# Patient Record
Sex: Male | Born: 1990 | Race: Black or African American | Hispanic: No | Marital: Single | Smoking: Former smoker
Health system: Southern US, Community
[De-identification: ages and names within clinical notes are randomized; demographics above are authoritative.]

## PROBLEM LIST (undated history)

## (undated) DIAGNOSIS — K602 Anal fissure, unspecified: Secondary | ICD-10-CM

## (undated) HISTORY — PX: ELBOW SURGERY: SHX618

## (undated) HISTORY — DX: Anal fissure, unspecified: K60.2

---
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
## 2009-09-09 ENCOUNTER — Emergency Department (HOSPITAL_COMMUNITY): Admission: EM | Admit: 2009-09-09 | Discharge: 2009-09-09 | Payer: Self-pay | Admitting: Emergency Medicine

## 2009-12-14 ENCOUNTER — Emergency Department (HOSPITAL_COMMUNITY): Admission: EM | Admit: 2009-12-14 | Discharge: 2009-12-14 | Payer: Self-pay | Admitting: Emergency Medicine

## 2010-01-03 ENCOUNTER — Emergency Department (HOSPITAL_COMMUNITY): Admission: EM | Admit: 2010-01-03 | Discharge: 2010-01-03 | Payer: Self-pay | Admitting: Emergency Medicine

## 2010-01-26 ENCOUNTER — Emergency Department (HOSPITAL_COMMUNITY): Admission: EM | Admit: 2010-01-26 | Discharge: 2010-01-26 | Payer: Self-pay | Admitting: Emergency Medicine

## 2010-02-19 ENCOUNTER — Emergency Department (HOSPITAL_COMMUNITY): Admission: EM | Admit: 2010-02-19 | Discharge: 2010-02-19 | Payer: Self-pay | Admitting: Emergency Medicine

## 2010-09-24 ENCOUNTER — Emergency Department (HOSPITAL_COMMUNITY): Admission: EM | Admit: 2010-09-24 | Discharge: 2010-09-24 | Payer: Self-pay | Admitting: Emergency Medicine

## 2010-09-26 ENCOUNTER — Emergency Department (HOSPITAL_COMMUNITY): Admission: EM | Admit: 2010-09-26 | Discharge: 2010-09-26 | Payer: Self-pay | Admitting: Emergency Medicine

## 2011-03-18 ENCOUNTER — Emergency Department (HOSPITAL_COMMUNITY)
Admission: EM | Admit: 2011-03-18 | Discharge: 2011-03-18 | Disposition: A | Discharge status: Home / Self Care | Payer: 59 | Attending: Emergency Medicine | Admitting: Emergency Medicine

## 2011-03-18 ENCOUNTER — Inpatient Hospital Stay (HOSPITAL_COMMUNITY)
Admission: AD | Admit: 2011-03-18 | Discharge: 2011-03-18 | Disposition: A | Discharge status: Other Acute Inpatient Hospital | Payer: 59 | Source: Ambulatory Visit | Attending: Obstetrics & Gynecology | Admitting: Obstetrics & Gynecology

## 2011-03-18 ENCOUNTER — Emergency Department (HOSPITAL_COMMUNITY): Payer: 59

## 2011-03-18 DIAGNOSIS — S0990XA Unspecified injury of head, initial encounter: Secondary | ICD-10-CM

## 2011-03-18 DIAGNOSIS — S0100XA Unspecified open wound of scalp, initial encounter: Secondary | ICD-10-CM

## 2011-03-18 DIAGNOSIS — Y92009 Unspecified place in unspecified non-institutional (private) residence as the place of occurrence of the external cause: Secondary | ICD-10-CM | POA: Insufficient documentation

## 2011-03-24 NOTE — Op Note (Signed)
[place]  Patient:    Jeremiah Griffith, Jeremiah Griffith Visit Number: [id number] MRN: [id number]          Service Type: SUR Location: PEDS 6121 [id number] Attending Physician:  Carolan Shiver Ii Dictated by:   Carlisle Beers. Dorothyann Gibbs, M.D. Proc. Date: 03/01/02 Admit Date:  03/01/2002                             Operative Report  PREOPERATIVE DIAGNOSIS:  Displaced supracondylar fracture left elbow.  SURGICAL PROCEDURES:  Closed manipulation and percutaneous pinning left supracondylar elbow fracture.  POSTOPERATIVE DIAGNOSIS:  Displaced supracondylar fracture left elbow.  SURGEON:  John L. Dorothyann Gibbs, M.D.  ASSISTANT:  Arnoldo Morale, P.A.- C.  ANESTHESIA:  General.  PROCEDURE:  Under general anesthesia, the left hand was placed in finger traps and the arm was suspended from an IV pole with the elbow flexed approximately 60 degrees and the humerus horizontal.  At this point, C-arm pictures showed anterior displacement of the distal humerus with angulation.  Closed manipulation was carried out with significant improvement in position and alignment.  Initial percutaneous pinning was done through two lateral pins and angulation of the distal fragment in flexion was encountered that was too much.  The pins were backed out and the fragment was remanipulated.  The pins were then reinserted and two pins lateral and one pin medial.  I gave excellent position and alignment of the supracondylar fracture.  Great care was taken to avoid the ulnar groove on the medial pin.  Significant swelling in the elbow was of course present.  Following final position, the pins were bent just outside the skin about a cm.  They were cut off removing any extra length.  Neosporin and Adaptic were placed about the base of the pins and sterile gauze and the patients arm was placed in a long arm splint with the elbow flexed approximately 50 degrees and the carrying angle was near anatomic.   The  patient returned to recovery in good condition. Dictated by:   Carlisle Beers. Dorothyann Gibbs, M.D. Attending Physician:  Carolan Shiver Ii DD:  03/01/02 TD:  03/02/02 Job: [id number]

## 2011-04-06 ENCOUNTER — Emergency Department (INDEPENDENT_AMBULATORY_CARE_PROVIDER_SITE_OTHER): Payer: 59

## 2011-04-06 ENCOUNTER — Emergency Department (HOSPITAL_BASED_OUTPATIENT_CLINIC_OR_DEPARTMENT_OTHER)
Admission: EM | Admit: 2011-04-06 | Discharge: 2011-04-06 | Disposition: A | Discharge status: Home / Self Care | Payer: 59 | Attending: Emergency Medicine | Admitting: Emergency Medicine

## 2011-04-06 DIAGNOSIS — R51 Headache: Secondary | ICD-10-CM

## 2011-04-06 LAB — GLUCOSE, CAPILLARY: Glucose-Capillary: 88 mg/dL (ref 70–99)

## 2011-04-14 ENCOUNTER — Encounter: Payer: Self-pay | Admitting: Gastroenterology

## 2011-04-14 ENCOUNTER — Ambulatory Visit (INDEPENDENT_AMBULATORY_CARE_PROVIDER_SITE_OTHER): Payer: 59 | Admitting: Gastroenterology

## 2011-04-14 ENCOUNTER — Other Ambulatory Visit (INDEPENDENT_AMBULATORY_CARE_PROVIDER_SITE_OTHER): Payer: 59

## 2011-04-14 DIAGNOSIS — E739 Lactose intolerance, unspecified: Secondary | ICD-10-CM

## 2011-04-14 DIAGNOSIS — K649 Unspecified hemorrhoids: Secondary | ICD-10-CM | POA: Insufficient documentation

## 2011-04-14 DIAGNOSIS — K5900 Constipation, unspecified: Secondary | ICD-10-CM | POA: Insufficient documentation

## 2011-04-14 LAB — CBC WITH DIFFERENTIAL/PLATELET
Basophils Absolute: 0 10*3/uL (ref 0.0–0.1)
Basophils Relative: 0.3 % (ref 0.0–3.0)
Eosinophils Absolute: 0 10*3/uL (ref 0.0–0.7)
Hemoglobin: 16.8 g/dL (ref 13.0–17.0)
Lymphocytes Relative: 29.1 % (ref 12.0–46.0)
Lymphs Abs: 1.7 10*3/uL (ref 0.7–4.0)
MCHC: 34.5 g/dL (ref 30.0–36.0)
MCV: 95.6 fl (ref 78.0–100.0)
Monocytes Absolute: 0.3 10*3/uL (ref 0.1–1.0)
Neutro Abs: 3.7 10*3/uL (ref 1.4–7.7)
RDW: 15 % — ABNORMAL HIGH (ref 11.5–14.6)

## 2011-04-14 LAB — BASIC METABOLIC PANEL
CO2: 31 mEq/L (ref 19–32)
GFR: 142.38 mL/min (ref >60.00)
Glucose, Bld: 87 mg/dL (ref 70–99)
Potassium: 4.9 mEq/L (ref 3.5–5.1)
Sodium: 141 mEq/L (ref 135–145)

## 2011-04-14 LAB — HEPATIC FUNCTION PANEL
Alkaline Phosphatase: 91 U/L (ref 39–117)
Bilirubin, Direct: 0.1 mg/dL (ref 0.0–0.3)
Total Bilirubin: 0.8 mg/dL (ref 0.3–1.2)
Total Protein: 8.3 g/dL (ref 6.0–8.3)

## 2011-04-14 LAB — SEDIMENTATION RATE: Sed Rate: 2 mm/hr (ref 0–22)

## 2011-04-14 LAB — VITAMIN B12: Vitamin B-12: 550 pg/mL (ref 211–911)

## 2011-04-14 LAB — IBC PANEL: Saturation Ratios: 24.8 % (ref 20.0–50.0)

## 2011-04-14 MED ORDER — HYDROCORTISONE ACE-PRAMOXINE 1-1 % RE CREA: TOPICAL_CREAM | RECTAL | Status: DC

## 2011-04-14 MED ORDER — POLYETHYLENE GLYCOL 3350 17 GM/SCOOP PO POWD: ORAL | Status: DC

## 2011-04-14 MED ORDER — LUBIPROSTONE 24 MCG PO CAPS: 24.0000 ug | ORAL_CAPSULE | Freq: Two times a day (BID) | ORAL | Status: AC

## 2011-04-14 MED ORDER — LIDOCAINE HCL 2 % EX GEL: CUTANEOUS | Status: DC

## 2011-04-14 NOTE — Progress Notes (Signed)
History of Present Illness:  This is a 20 year old African American male self-referred for evaluation of one and a half years of progressive constipation with associated hemorrhoidal bleeding and hemorrhoidal discomfort. He has not had previous barium studies or endoscopic exams. He goes to the bathroom every 3-4 days with laxative use, has associated straining and hemorrhoidal bleeding and discomfort. He describes prolapsing hemorrhoids partially responsive to cortisone suppositories. Family history is noncontributory. He denies other gastrointestinal or hepatobiliary complaints or general medical problems. He does have lactose intolerance but his appetite is good and his weight is stable. He denies skin rashes, joint pains, oral stomatitis, or visual difficulties.  I have reviewed this patient's present history, medical and surgical past history, allergies and medications.     ROS: The remainder of the 10 point ROS is negative. No symptoms of hyperthyroidism or other neurological difficulties. He denies previous hepatitis, pancreatitis, or other gastrointestinal problems or gastrointestinal surgery  Past Medical History  Diagnosis Date  . Hemorrhoids    Past Surgical History  Procedure Date  . Elbow surgery     reports that he has been smoking Cigarettes and Cigars.  He has smoked for the past 2 years. He has never used smokeless tobacco. He reports that he drinks alcohol. He reports that he does not use illicit drugs. family history is not on file.  He is adopted. Allergies  Allergen Reactions  . Penicillins         Physical Exam: General well developed well nourished patient in no acute distress, appearing his stated age Eyes PERRLA, no icterus, fundoscopic exam per opthamologist Skin no lesions noted Neck supple, no adenopathy, no thyroid enlargement, no tenderness Chest clear to percussion and auscultation Heart no significant murmurs, gallops or rubs noted Abdomen no  hepatosplenomegaly masses or tenderness, BS normal.  Rectal inspection .. anterior external hemorrhoid noted which is nonthrombosed and nontender. There also appears to be some mixed hemorrhoid tissue posteriorly without a definite fissure or fistula. Rectal exam shows no masses or tenderness with soft stool present which is guaiac negative. Prostate is not enlarged, nodular, or tender. Extremities no acute joint lesions, edema, phlebitis or evidence of cellulitis. Neurologic patient oriented x 3, cranial nerves intact, no focal neurologic deficits noted. Psychological mental status normal and normal affect.  Assessment and plan: Chronic functional constipation with associated straining and mixed hemorrhoidal problems. Have prescribed fiber diet with liberal by mouth fluids and Perdiem with senna as needed, MiraLax 8 ounces each bedtime, Amitiza 24 mcg twice a day as tolerated, and alternating 5% Xylocaine cream with Analpram cream every 4-6 hours. He is to see me back in 2 weeks' time for followup. Screening labs ordered today including CBC, anemia profile, metabolic functions, and celiac panel.  Encounter Diagnoses  Name Primary?  . Hemorrhoids   . Constipation

## 2011-04-14 NOTE — Patient Instructions (Signed)
Take Amitiza twice a day with meals, samples given and rx sent.  Use Analpram and Lidocaine alt every 6 hours per rectum, rx sent. Buy Perdum with Senna otc and take one tablet twice a day if you develop diarrhea stop this.  Please go to the basement today for your labs.    Hemorrhoids Hemorrhoids are dilated (enlarged) veins around the rectum. Sometimes clots will form in the veins. This makes them swollen and painful. These are called thrombosed hemorrhoids. Causes of hemorrhoids include:  Pregnancy: this increases the pressure in the hemorrhoidal veins.   Constipation.   Straining to have a bowel movement.  HOME CARE INSTRUCTIONS  Eat a well balanced diet and drink 6 to 8 glasses of water every day to avoid constipation. You may also use a bulk laxative.   Avoid straining to have bowel movements.   Keep anal area dry and clean.   Only take over-the-counter or prescription medicines for pain, discomfort, or fever as directed by your caregiver.  If thrombosed:  Take hot sitz baths for 20 to 30 minutes, 3 to 4 times per day.   If the hemorrhoids are very tender and swollen, place ice packs on area as tolerated. Using ice packs between sitz baths may be helpful. Fill a plastic bag with ice and use a towel between the bag of ice and your skin.   Special creams and suppositories (Anusol, Nupercainal, Wyanoids) may be used or applied as directed.   Do not use a donut shaped pillow or sit on the toilet for long periods. This increases blood pooling and pain.   Move your bowels when your body has the urge; this will require less straining and will decrease pain and pressure.   Only take over-the-counter or prescription medicines for pain, discomfort, or fever as directed by your caregiver.  SEEK MEDICAL CARE IF:  You have increasing pain and swelling that is not controlled with your prescription.   You have uncontrolled bleeding.   You have an inability or difficulty having a  bowel movement.   You have pain or inflammation outside the area of the hemorrhoids.   MAKE SURE YOU:   Understand these instructions.   Will watch your condition.   Will get help right away if you are not doing well or get worse.  Document Released: 10/20/2000 Document Re-Released: 10/05/2008 [facility] Patient Information 2011 [place], [place].

## 2011-04-16 ENCOUNTER — Emergency Department (HOSPITAL_BASED_OUTPATIENT_CLINIC_OR_DEPARTMENT_OTHER)
Admission: EM | Admit: 2011-04-16 | Discharge: 2011-04-16 | Disposition: A | Discharge status: Home / Self Care | Payer: 59 | Attending: Emergency Medicine | Admitting: Emergency Medicine

## 2011-04-16 ENCOUNTER — Emergency Department (INDEPENDENT_AMBULATORY_CARE_PROVIDER_SITE_OTHER): Payer: 59

## 2011-04-16 DIAGNOSIS — S61209A Unspecified open wound of unspecified finger without damage to nail, initial encounter: Secondary | ICD-10-CM

## 2011-04-16 DIAGNOSIS — W269XXA Contact with unspecified sharp object(s), initial encounter: Secondary | ICD-10-CM | POA: Insufficient documentation

## 2011-04-16 DIAGNOSIS — X58XXXA Exposure to other specified factors, initial encounter: Secondary | ICD-10-CM

## 2011-04-17 ENCOUNTER — Emergency Department (HOSPITAL_BASED_OUTPATIENT_CLINIC_OR_DEPARTMENT_OTHER)
Admission: EM | Admit: 2011-04-17 | Discharge: 2011-04-17 | Discharge status: Against Medical Advice | Payer: 59 | Attending: Emergency Medicine | Admitting: Emergency Medicine

## 2011-04-17 DIAGNOSIS — Z09 Encounter for follow-up examination after completed treatment for conditions other than malignant neoplasm: Secondary | ICD-10-CM | POA: Insufficient documentation

## 2011-04-17 LAB — CELIAC PANEL 10
Endomysial Screen: NEGATIVE
Gliadin IgG: 5.8 U/mL (ref <20)
IgA: 424 mg/dL — ABNORMAL HIGH (ref 68–379)
Tissue Transglutaminase Ab, IgA: 10.5 U/mL (ref <20)

## 2011-04-28 ENCOUNTER — Ambulatory Visit: Payer: 59 | Admitting: Gastroenterology

## 2011-05-07 HISTORY — PX: COLONOSCOPY W/ BIOPSIES: SHX1374

## 2011-05-17 ENCOUNTER — Encounter: Payer: Self-pay | Admitting: Gastroenterology

## 2011-05-17 ENCOUNTER — Ambulatory Visit (INDEPENDENT_AMBULATORY_CARE_PROVIDER_SITE_OTHER): Payer: 59 | Admitting: Gastroenterology

## 2011-05-17 ENCOUNTER — Telehealth: Payer: Self-pay | Admitting: Gastroenterology

## 2011-05-17 VITALS — BP 100/62 | HR 80 | Ht 69.0 in | Wt 129.6 lb

## 2011-05-17 DIAGNOSIS — K5900 Constipation, unspecified: Secondary | ICD-10-CM

## 2011-05-17 DIAGNOSIS — K602 Anal fissure, unspecified: Secondary | ICD-10-CM

## 2011-05-17 DIAGNOSIS — K6289 Other specified diseases of anus and rectum: Secondary | ICD-10-CM | POA: Insufficient documentation

## 2011-05-17 DIAGNOSIS — K601 Chronic anal fissure: Secondary | ICD-10-CM

## 2011-05-17 DIAGNOSIS — K5909 Other constipation: Secondary | ICD-10-CM

## 2011-05-17 MED ORDER — PEG-KCL-NACL-NASULF-NA ASC-C 100 G PO SOLR: 1.0000 | Freq: Once | ORAL | Status: DC

## 2011-05-17 MED ORDER — ULTRAM 50 MG PO TABS: 50.0000 mg | ORAL_TABLET | Freq: Four times a day (QID) | ORAL | Status: DC | PRN

## 2011-05-17 MED ORDER — AMBULATORY NON FORMULARY MEDICATION: Status: DC

## 2011-05-17 NOTE — Progress Notes (Signed)
This is a 20 year old male with continued rectal pain despite local Xylocaine for documented anal fissure. He also had rather severe constipation which is resolved with daily MiraLax. This problem is been going on for over a year. He has not had previous barium studies or colonoscopy. Review of his labs shows no specific abnormalities including celiac panel. Current Medications, Allergies, Past Medical History, Past Surgical History, Family History and Social History were reviewed in [employer] record.  Pertinent Review of Systems Negative   Physical Exam: I cannot appreciate stigmata of chronic liver disease. Abdominal exam is entirely benign. There is a posterior chronic-appearing fissure on rectal exam. I did not perform digital exam.    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 I have placed him on diltiazem  gel 4- 5 times a day, when necessary tramadol 50 mg every 6-8 hours for severe pain, and schedule colonoscopy with propofol sedation. Encounter Diagnosis  Name Primary?  . Rectal pain Yes

## 2011-05-17 NOTE — Telephone Encounter (Signed)
Advised there is a $20 rebate he would like to pick up when he comes for his procedure.

## 2011-05-17 NOTE — Patient Instructions (Addendum)
Your rxs have been sent to [facility] since the rectal cream can only be made there, the address is [address] The phone number is [phone]. Your procedure has been scheduled for 05/24/2011, please follow the seperate instructions.

## 2011-05-24 ENCOUNTER — Ambulatory Visit (AMBULATORY_SURGERY_CENTER): Payer: 59 | Admitting: Gastroenterology

## 2011-05-24 ENCOUNTER — Encounter: Payer: Self-pay | Admitting: Gastroenterology

## 2011-05-24 DIAGNOSIS — K602 Anal fissure, unspecified: Secondary | ICD-10-CM

## 2011-05-24 DIAGNOSIS — K6289 Other specified diseases of anus and rectum: Secondary | ICD-10-CM

## 2011-05-24 DIAGNOSIS — K620 Anal polyp: Secondary | ICD-10-CM

## 2011-05-24 DIAGNOSIS — D126 Benign neoplasm of colon, unspecified: Secondary | ICD-10-CM

## 2011-05-24 DIAGNOSIS — K625 Hemorrhage of anus and rectum: Secondary | ICD-10-CM

## 2011-05-24 DIAGNOSIS — R109 Unspecified abdominal pain: Secondary | ICD-10-CM

## 2011-05-24 MED ORDER — SODIUM CHLORIDE 0.9 % IV SOLN: 500.0000 mL | INTRAVENOUS | Status: DC

## 2011-05-24 NOTE — Patient Instructions (Signed)
Please refer to blue and green discharge instruction sheets. 

## 2011-05-25 ENCOUNTER — Telehealth: Payer: Self-pay | Admitting: *Deleted

## 2011-05-25 NOTE — Telephone Encounter (Signed)
Not a working number for home nor cell.

## 2011-05-29 ENCOUNTER — Ambulatory Visit (INDEPENDENT_AMBULATORY_CARE_PROVIDER_SITE_OTHER): Payer: 59 | Admitting: General Surgery

## 2011-05-29 ENCOUNTER — Telehealth (INDEPENDENT_AMBULATORY_CARE_PROVIDER_SITE_OTHER): Payer: Self-pay | Admitting: General Surgery

## 2011-05-29 ENCOUNTER — Encounter (INDEPENDENT_AMBULATORY_CARE_PROVIDER_SITE_OTHER): Payer: Self-pay | Admitting: General Surgery

## 2011-05-29 VITALS — BP 100/64 | HR 64 | Temp 96.2°F | Ht 69.0 in | Wt 130.0 lb

## 2011-05-29 DIAGNOSIS — K644 Residual hemorrhoidal skin tags: Secondary | ICD-10-CM

## 2011-05-29 DIAGNOSIS — K602 Anal fissure, unspecified: Secondary | ICD-10-CM

## 2011-05-29 NOTE — Progress Notes (Signed)
Jeremiah Griffith is a 20 y.o. male.    Chief Complaint  Patient presents with  . Other    eval hem/colonoscopy    HPI HPI 20 year old African American male referred by Dr. Jarold Motto for evaluation of rectal pain. The patient states that he's had several years of rectal pain. He states that he has a fissure as well as an external hemorrhoid. He describes that he will have a "flare up" about 2-3 times per month for the past 2 years. When he has a flareup, he states that his external hemorrhoid will enlarge. it will become painful for several days. It'll be difficult to sit down. The worsening pain will last for several hours. The flare will last for about 3 days. It will also burn and itch.   He states that he has a bowel movement about every 2-3 days. He will sit on the commode for 5-7 minutes at a time. He denies any fecal or urinary incontinence. He does strain on occasion. He states that he was diagnosed with a fissure several months ago. He has tried lidocaine ointment, Analpram, suppositories and tub soaks without relief. He was placed on diltiazem ointment for his fissure. He used it several times a day for 2 weeks and stopped because he did not notice any improvement. He denies any crampy abdominal pain. He denies any weight loss. He denies any fevers or chills. He denies any melena or hematochezia. He underwent a colonoscopy several days ago.  Past Medical History  Diagnosis Date  . Hemorrhoids   . Anal fissure     Past Surgical History  Procedure Date  . Elbow surgery     left  . Colonoscopy w/ biopsies 05/2011    Family History  Problem Relation Age of Onset  . Adopted: Yes    Social History History  Substance Use Topics  . Smoking status: Never Smoker   . Smokeless tobacco: Never Used   Comment: 3 a week  . Alcohol Use: Yes     2-3 weekend drinks    Allergies  Allergen Reactions  . Penicillins Other (See Comments)    Happened when patient was a child. Does not  remember the reaction.    Current Outpatient Prescriptions  Medication Sig Dispense Refill  . ULTRAM 50 MG tablet Take 1 tablet (50 mg total) by mouth every 6 (six) hours as needed for pain.  90 tablet  0  . AMBULATORY NON FORMULARY MEDICATION Diltiazem gel 2% Apply small amount to rectum four times a day  20 oz  1  . lidocaine (XYLOCAINE JELLY) 2 % jelly Alt every 6 hours with Analpram  30 mL  1  . peg 3350 powder (MOVIPREP) 100 G SOLR Take 1 kit (100 g total) by mouth once.  1 kit  0  . polyethylene glycol powder (MIRALAX) powder Mix one capful in 8 ounces of water and drink at bedtime  255 g  0  . pramoxine-hydrocortisone (ANALPRAM-HC) 1-1 % rectal cream Alt every 6 hours with lidocaine jelly  30 g  1   Current Facility-Administered Medications  Medication Dose Route Frequency Provider Last Rate Last Dose  . 0.9 %  sodium chloride infusion  500 mL Intravenous Continuous Sheryn Bison, MD        Review of Systems Review of Systems  Constitutional: Negative for fever, chills and weight loss.       No h/o STDs  HENT: Negative.   Eyes: Negative.   Respiratory: Negative.  Cardiovascular: Negative for chest pain and palpitations.  Gastrointestinal:       See hpi  Genitourinary: Negative for dysuria and urgency.  Musculoskeletal: Negative.   Skin: Negative.   Neurological: Negative.   Endo/Heme/Allergies: Negative.   Psychiatric/Behavioral: Negative.   All other systems reviewed and are negative.    Physical Exam Physical Exam  Vitals reviewed. Constitutional: He is oriented to person, place, and time. He appears well-developed and well-nourished.  HENT:  Head: Normocephalic and atraumatic.  Eyes: Conjunctivae are normal. No scleral icterus.  Neck: Normal range of motion. No tracheal deviation present.  Cardiovascular: Normal rate, regular rhythm and normal heart sounds.   Respiratory: Effort normal and breath sounds normal. He has no wheezes.  GI: Soft. Bowel sounds  are normal. He exhibits no distension. There is no tenderness. Hernia confirmed negative in the right inguinal area and confirmed negative in the left inguinal area.  Genitourinary: Testes normal and penis normal. Rectal exam shows external hemorrhoid (left posterolateral; adj to fissure) and fissure (posterior midline).          DRE deferred secondary to pt discomfort  Musculoskeletal: Normal range of motion.  Lymphadenopathy:    He has no cervical adenopathy.       Right: No inguinal adenopathy present.       Left: No inguinal adenopathy present.  Neurological: He is alert and oriented to person, place, and time.  Skin: Skin is warm and dry.  Psychiatric: He has a normal mood and affect. His behavior is normal. Thought content normal.     Blood pressure 100/64, pulse 64, temperature 96.2 F (35.7 C), temperature source Temporal, height 5\' 9"  (1.753 m), weight 130 lb (58.968 kg).   Data Reviewed: 1) Dr Norval Gable note 2)Dr Byerly's note from 2009 3) colonoscopy from 05/24/11- no IBD; sessile rectal polyp-snared & biopsied; anal fissure; Path Pending  Assessment/Plan 20 year old African male with a single column left posterior lateral external hemorrhoid and an anal fissure.  He states that intermittently engorgement of the external hemorrhoid has been ongoing for at least a year and a half. This is what causes him is most discomfort. He states the fissure has only been present for about 2 months.  We discussed management of anal fissures as well as external hemorrhoids. I think the underlying problem is his infrequent stools. I advised him that he would have ongoing problems as long as he had infrequent stools. He was given Agricultural engineer. I recommended that he increase his daily water intake as well as increasing his daily fiber intake. We discussed foods that were high in fiber. We also discussed adding supplemental fiber in his diet. I recommended that he slowly increase the  fiber over several weeks. I also recommended that he continue to use diltiazem ointment for his anal fissure.  We discussed timing of surgical intervention. The patient would like to have his external hemorrhoid excised as he believes this is the primary source of his problems. I explained to him that I could excise this external hemorrhoid but this in no way would guarantee resolution of his rectal pain. We discussed postoperative stratergies that would decrease his postoperative pain such as warm tub soaks, stool softeners, laxatives as needed, and avoidance of narcotics.   We also discussed nonsurgical and surgical management anal fissures. I recommended continued nonsurgical management for the time being. I recommended that he continue to use the diltiazem ointment for a minimum of 6 weeks as well as increasing the fiber in his  diet in an intent to make his stools more regular which will hopefully improve his anal fissure. We did discuss lateral anal sphincterotomy. We discussed the potential risk of incontinence to flatus, incontinence to liquid stool, and incontinence the solid stool.   We will plan to do an exam under anesthesia and excision of a left posterior lateral single column external hemorrhoid in the near future. He will do an enema the morning of surgery.  Gaynelle Adu M 05/29/2011, 11:26 AM

## 2011-05-29 NOTE — Patient Instructions (Addendum)
Day of surgery: please use one fleets enema per rectum atleast two hours prior to leaving the house.  Anal Fissure An anal fissure often begins with sharp pain. This is usually following a bowel movement. It often causes bright red blood stained stools. It is the most common cause of rectal bleeding. One common cause of this is passage of a large, hard stool. It can also be caused by having frequent diarrheal stools. Anal fissures that occur for a longtime (chronic) may require surgery. CAUSES  Passing large, hard stools.   Frequent diarrheal stools.   Constipation.  SYMPTOMS  Bright red, blood stained stools.   Rectal bleeding.  HOME CARE INSTRUCTIONS  If constipation is the cause of the rectal fissure, it may be necessary to add a bulk-forming laxative. A diet high in fruits, whole grains, and vegetables will also help.   Taking hot sitz baths for 1 half hour 4 times per day may help.   Increase your fluid intake.   Only take over-the-counter or prescription medicines for pain, discomfort, or fever as directed by your caregiver. Do not take aspirin as this may increase the tendency for bleeding.   Do not use ointments containing anesthetic medications or hydrocortisone. They could slow healing.   Avoid constipating foods such as bananas and cheese.   In children, Nikkel Karo syrup may be used by adding 1 teaspoon of syrup to 8 ounces of formula. An alternative is to give 3 teaspoons of mineral oil every day.  SEEK MEDICAL CARE IF: Rectal bleeding continues, changes in intensity, or becomes more severe. MAKE SURE YOU:   Understand these instructions.   Will watch your condition.   Will get help right away if you are not doing well or get worse.  Document Released: 10/23/2005 Document Re-Released: 01/17/2010 [facility] Patient Information 2011 [place], [place].  Constipation in Adults Constipation is having fewer than 2 bowel movements per week. Usually, the stools are hard.  As we grow older, constipation is more common. If you try to fix constipation with laxatives, the problem may get worse. This is because laxatives taken over a long period of time make the colon muscles weaker. A low-fiber diet, not taking in enough fluids, and taking some medicines may make these problems worse. MEDICATIONS THAT MAY CAUSE CONSTIPATION  Water pills (diuretics).  Calcium channel blockers (used to control blood pressure and for the heart).   Certain pain medicines (narcotics).   Anticholinergics.  Anti-inflammatory agents.   Antacids that contain aluminum.   DISEASES THAT CONTRIBUTE TO CONSTIPATION  Diabetes.  Parkinson's disease.   Dementia.   Stroke.  Depression.   Illnesses that cause problems with salt and water metabolism.   HOME CARE INSTRUCTIONS  Constipation is usually best cared for without medicines. Increasing dietary fiber and eating more fruits and vegetables is the best way to manage constipation.   Slowly increase fiber intake to 25 to 38 grams per day. Whole grains, fruits, vegetables, and legumes are good sources of fiber. A dietitian can further help you incorporate high-fiber foods into your diet.   Drink enough water and fluids to keep your urine clear or pale yellow.   A fiber supplement may be added to your diet if you cannot get enough fiber from foods.   Increasing your activities also helps improve regularity.   Suppositories, as suggested by your caregiver, will also help. If you are using antacids, such as aluminum or calcium containing products, it will be helpful to switch to products  containing magnesium if your caregiver says it is okay.   If you have been given a liquid injection (enema) today, this is only a temporary measure. It should not be relied on for treatment of longstanding (chronic) constipation.   Stronger measures, such as magnesium sulfate, should be avoided if possible. This may cause uncontrollable diarrhea. Using  magnesium sulfate may not allow you time to make it to the bathroom.  SEEK IMMEDIATE MEDICAL CARE IF:  There is bright red blood in the stool.   The constipation stays for more than 4 days.   There is belly (abdominal) or rectal pain.   You do not seem to be getting better.   You have any questions or concerns.  MAKE SURE YOU:  Understand these instructions.   Will watch your condition.   Will get help right away if you are not doing well or get worse.  Document Released: 07/21/2004 Document Re-Released: 01/17/2010 [facility] Patient Information 2011 [place], [place].

## 2011-05-29 NOTE — Telephone Encounter (Signed)
Patient called back and I advised one week should be enough time, but we can always extended that if needed. He asked for a note for work which I will write to today and leave at the front for patient to pick up. He will call back with any further questions.

## 2011-05-29 NOTE — Telephone Encounter (Signed)
Patient called and left message on my voicemail re: time out of work. Per Dr Andrey Campanile patient should take about a week off of work. I called patient back and got voicemail and left message for patient to call me back.

## 2011-05-30 ENCOUNTER — Encounter: Payer: Self-pay | Admitting: Gastroenterology

## 2011-06-07 ENCOUNTER — Other Ambulatory Visit (HOSPITAL_COMMUNITY): Payer: 59

## 2011-06-09 ENCOUNTER — Other Ambulatory Visit (INDEPENDENT_AMBULATORY_CARE_PROVIDER_SITE_OTHER): Payer: Self-pay | Admitting: General Surgery

## 2011-06-09 ENCOUNTER — Encounter (HOSPITAL_COMMUNITY): Payer: 59

## 2011-06-09 LAB — CBC
HCT: 44.2 % (ref 39.0–52.0)
MCHC: 33.5 g/dL (ref 30.0–36.0)
RDW: 13.5 % (ref 11.5–15.5)
WBC: 7.5 10*3/uL (ref 4.0–10.5)

## 2011-06-09 LAB — SURGICAL PCR SCREEN
MRSA, PCR: NEGATIVE
Staphylococcus aureus: NEGATIVE

## 2011-06-13 ENCOUNTER — Ambulatory Visit (HOSPITAL_COMMUNITY)
Admission: RE | Admit: 2011-06-13 | Discharge: 2011-06-13 | Disposition: A | Discharge status: Home / Self Care | Payer: 59 | Source: Ambulatory Visit | Attending: General Surgery | Admitting: General Surgery

## 2011-06-13 DIAGNOSIS — K649 Unspecified hemorrhoids: Secondary | ICD-10-CM

## 2011-06-13 DIAGNOSIS — K602 Anal fissure, unspecified: Secondary | ICD-10-CM | POA: Insufficient documentation

## 2011-06-13 DIAGNOSIS — Z01812 Encounter for preprocedural laboratory examination: Secondary | ICD-10-CM | POA: Insufficient documentation

## 2011-06-13 DIAGNOSIS — K644 Residual hemorrhoidal skin tags: Secondary | ICD-10-CM | POA: Insufficient documentation

## 2011-06-13 HISTORY — PX: EXCISIONAL HEMORRHOIDECTOMY: SHX1541

## 2011-06-14 ENCOUNTER — Telehealth (INDEPENDENT_AMBULATORY_CARE_PROVIDER_SITE_OTHER): Payer: Self-pay

## 2011-06-14 NOTE — Telephone Encounter (Signed)
Pt just had hem surgery yesterday and c/o the hemorrhoid still being there.  I told him this is swelling and will get better.  He is taking Oxycodone and Aleve q4prn without relief.  The pain seems worse.  He is drinking liquids and taking a fiber supplement.  He has only soaked in a tub twice since surgery.  I told him make sure he does it at least 5 times to help with swelling and pain.  I spoke to Dr Andrey Campanile and he did not want to change any of his orders but to increase his soaking.  I enforced this with the pt and asked him to call back by Friday if no better

## 2011-06-16 ENCOUNTER — Encounter (INDEPENDENT_AMBULATORY_CARE_PROVIDER_SITE_OTHER): Payer: Self-pay | Admitting: General Surgery

## 2011-06-16 ENCOUNTER — Ambulatory Visit (INDEPENDENT_AMBULATORY_CARE_PROVIDER_SITE_OTHER): Payer: 59 | Admitting: General Surgery

## 2011-06-16 VITALS — BP 112/60 | HR 84 | Temp 97.6°F | Ht 69.0 in | Wt 132.0 lb

## 2011-06-16 DIAGNOSIS — Z8719 Personal history of other diseases of the digestive system: Secondary | ICD-10-CM

## 2011-06-16 DIAGNOSIS — Z09 Encounter for follow-up examination after completed treatment for conditions other than malignant neoplasm: Secondary | ICD-10-CM

## 2011-06-16 DIAGNOSIS — Z9889 Other specified postprocedural states: Secondary | ICD-10-CM

## 2011-06-16 MED ORDER — OXYCODONE-ACETAMINOPHEN 7.5-325 MG PO TABS: 1.0000 | ORAL_TABLET | ORAL | Status: DC | PRN

## 2011-06-16 NOTE — Progress Notes (Signed)
Procedure: Exam under anesthesia, left posterior lateral excisional hemorrhoidectomy June 13 2011  History of Present Ilness: Patient underwent the above-mentioned procedure several days ago. He comes in complaining of severe perirectal pain. He denies any trouble urinating. He has not had a bowel movement since surgery. He denies any fevers or chills. He denies any drainage from his rectum. He has been taking the Percocet every 4 hours 2 tablets at a time. He states that he's also been soaking in a sitz bath several times a day. He also has been taking stool softeners and Citrucel without any relief of his rectal pain. He has not been using any lidocaine ointment. He's also been taking some Aleve.  Physical Exam: BP 112/60  Pulse 84  Temp 97.6 F (36.4 C)  Ht 5\' 9"  (1.753 m)  Wt 132 lb (59.875 kg)  BMI 19.49 kg/m2  Well-developed well-nourished African American male in no apparent distress Pulmonary-lungs are clear to auscultation Cardiac-regular rate and rhythm Abdomen-soft nontender nondistended Rectal-incision in the left posterior lateral position, chromic sutures are still intact in place. No cellulitis. No hematoma. No overt edema. Digital rectal exam was deferred   Assessment and Plan: Status post exam under anesthesia with excision of left posterior lateral external hemorrhoid with postoperative pain.  There are no signs of infection. I've encouraged to continue to drink 8-10 glasses of water a day. Also encouraged him to continue with warm water tub soaks  several times a day. Also told him he needs to take MiraLax or milk of magnesia today in order to have a bowel movement. He was given a prescription for Percocet 7-1/2 mg. I also encouraged him to start using the lidocaine ointment that he has at home. Hopefully with these measures his pain will improve. He was given educational material  I'll see him at his regular scheduled postop appointment.

## 2011-06-16 NOTE — Progress Notes (Deleted)
Transfer text

## 2011-06-16 NOTE — Op Note (Signed)
NAMERAPHEL, STICKLES                 ACCOUNT NO.:  [id number]  MEDICAL RECORD NO.:  [id number]  LOCATION:  DAYL                         FACILITY:  [facility]  PHYSICIAN:  Mary Sella. Andrey Campanile, MD     DATE OF BIRTH:  1991/10/03  DATE OF PROCEDURE:  06/13/2011 DATE OF DISCHARGE:  06/13/2011                              OPERATIVE REPORT   PREOPERATIVE DIAGNOSES: 1. Anal fissure. 2. Left posterolateral external hemorrhoid.  POSTOPERATIVE DIAGNOSES: 1. Anal fissure. 2. Left posterolateral external hemorrhoid.  PROCEDURES: 1. Examination under anesthesia. 2. Excision of left posterolateral external hemorrhoid.  SURGEON:  Mary Sella. Andrey Campanile, MD  ANESTHESIA:  General plus 20 mL of Exparel plus 4 mL of 0.25% Marcaine with epinephrine.  SPECIMEN:  Left posterolateral external hemorrhoid, which was discarded.  ESTIMATED BLOOD LOSS:  Minimal.  FINDINGS:  The patient had a very small posterior midline anal fissure. There was a large amount of stool within his rectal vault, so the anoscopy was somewhat limited due to the stool burden.  He had nonthrombosed external hemorrhoids in the left posterolateral position.  INDICATIONS FOR PROCEDURE:  Patient is a 20 year old male who has had several years of rectal pain.  He states that he has had an anal fissure as well as an external hemorrhoid.  His main complaint is his external hemorrhoid.  He says about two to three times a month the external hemorrhoid will enlarge and gets swollen and will be painful for several days.  The pain will last for about 3 days.  He had undergone a colonoscopy by Dr. Jarold Motto.  We discussed several options such as continued management for his anal fissure versus going to the operating room for his external hemorrhoid.  The patient did not have daily pain with bowel movements.  He was adamant that the area where the redundant external hemorrhoidal tissue was, that this was the location where it would flare-up and gets  edematous and swollen and he had desired surgical intervention first prior to undergoing continued medical management.  We discussed the risks and benefits of surgery including but not limited to bleeding, infection, urinary retention, hemorrhoid recurrence, failure to ameliorate his pain, injury to the sphincter, blood clot formation as well as general anesthesia risks.  DESCRIPTION OF PROCEDURE:  After obtaining informed consent, the patient was taken back to the operating room.  It should be noted that the patient forgot to do and failed to do his enema the morning of surgery. But since this was an external hemorrhoid, I elected to proceed to the operating room.  General anesthesia was established.  He was then placed in the prone jackknife position on the operating room table with the appropriate padding and sequential compression devices.  His buttocks were taped apart.  His perineum and anal region were prepped with Betadine.  He received antibiotics prior to skin incision.  A surgical time-out was performed.  On gross visual inspection, he had a very small anal fissure in his posterior midline.  It was about 4 mm and about 2 mm wide.  In the left posterolateral position he had nonthrombosed redundant external hemorrhoidal tissue.  Anoscopy was performed; however, it was somewhat  limited due to the amount of the stool in the rectal vault.  Grossly there was no large internal hemorrhoids.  At this point I infiltrated some local with epinephrine at the base of the external hemorrhoidal tissue massaged and replaced.  I then made a V-shaped incision with #15 blade and then lifted the external hemorrhoid off from the underlying submucosa.  Then using electrocautery, I excised the external hemorrhoid in an elliptical fashion all the way up to its apex.  I then reapproximated the mucosa with a running 3-0 chromic suture starting internally and carrying it down toward his perineum.  I  left a small gap to allow drainage.  At this point it had been about 20 minutes since I injected the initial local and at this point I then did a field block of Exparel consisting of 20 mL.  A piece of Gelfoam was placed in the rectum.  The patient was then rolled into the supine position on the OR stretcher, extubated and taken to the recovery room in stable condition. Prior to doing that 4x4s, ABDs, and mesh underwear had been placed.  The patient tolerated the procedure well, no immediate complications.     Mary Sella. Andrey Campanile, MD     EMW/MEDQ  D:  06/13/2011  T:  06/14/2011  Job:  [id number]  cc:   Vania Rea. Jarold Motto, MD, FACG, FACP, FAGA [address] [place] [address]  Electronically Signed by Gaynelle Adu M.D. on 06/16/2011 10:00:57 AM

## 2011-06-16 NOTE — Patient Instructions (Signed)
Take miralax or milk of magnesia today. Continue soaking in a water bath several times a day for 15 min & after a bowel movement.  Starting using Lidocaine ointment as previously prescribed.  GETTING TO GOOD BOWEL HEALTH. Irregular bowel habits such as constipation and diarrhea can lead to many problems over time.  Having one soft bowel movement a day is the most important way to prevent further problems.  The anorectal canal is designed to handle stretching and feces to safely manage our ability to get rid of solid waste (feces, poop, stool) out of our body.  BUT, hard constipated stools can act like ripping concrete bricks and diarrhea can be a burning fire to this very sensitive area of our body, causing inflamed hemorrhoids, anal fissures, increasing risk is perirectal abscesses, abdominal pain/bloating, an making irritable bowel worse.     The goal: ONE SOFT BOWEL MOVEMENT A DAY!  To have soft, regular bowel movements:    Drink at least 8 tall glasses of water a day.     Take plenty of fiber.  Fiber is the undigested part of plant food that passes into the colon, acting s "natures broom" to encourage bowel motility and movement.  Fiber can absorb and hold large amounts of water. This results in a larger, bulkier stool, which is soft and easier to pass. Work gradually over several weeks up to 6 servings a day of fiber (25g a day even more if needed) in the form of: o Vegetables -- Root (potatoes, carrots, turnips), leafy green (lettuce, salad greens, celery, spinach), or cooked high residue (cabbage, broccoli, etc) o Fruit -- Fresh (unpeeled skin & pulp), Dried (prunes, apricots, cherries, etc ),  or stewed ( applesauce)  o Whole grain breads, pasta, etc (whole wheat)  o Bran cereals    Bulking Agents -- This type of water-retaining fiber generally is easily obtained each day by one of the following:  o Psyllium bran -- The psyllium plant is remarkable because its ground seeds can retain so much  water. This product is available as Metamucil, Konsyl, Effersyllium, Per Diem Fiber, or the less expensive generic preparation in drug and health food stores. Although labeled a laxative, it really is not a laxative.  o Methylcellulose -- This is another fiber derived from wood which also retains water. It is available as Citrucel. o Polyethylene Glycol - and "artificial" fiber commonly called Miralax or Glycolax.  It is helpful for people with gassy or bloated feelings with regular fiber o Flax Seed - a less gassy fiber than psyllium   No reading or other relaxing activity while on the toilet. If bowel movements take longer than 5 minutes, you are too constipated   AVOID CONSTIPATION.  High fiber and water intake usually takes care of this.  Sometimes a laxative is needed to stimulate more frequent bowel movements, but    Laxatives are not a good long-term solution as it can wear the colon out. o Osmotics (Milk of Magnesia, Fleets phosphosoda, Magnesium citrate, MiraLax, GoLytely) are safer than  o Stimulants (Senokot, Castor Oil, Dulcolax, Ex Lax)    o Do not take laxatives for more than 7days in a row.    IF SEVERELY CONSTIPATED, try a Bowel Retraining Program: o Do not use laxatives.  o Eat a diet high in roughage, such as bran cereals and leafy vegetables.  o Drink six (6) ounces of prune or apricot juice each morning.  o Eat two (2) large servings of stewed fruit  each day.  o Take one (1) heaping tablespoon of a psyllium-based bulking agent twice a day. Use sugar-free sweetener when possible to avoid excessive calories.  o Eat a normal breakfast.  o Set aside 15 minutes after breakfast to sit on the toilet, but do not strain to have a bowel movement.  o If you do not have a bowel movement by the third day, use an enema and repeat the above steps.    Controlling diarrhea o Switch to liquids and simpler foods for a few days to avoid stressing your intestines further. o Avoid dairy products  (especially milk & ice cream) for a short time.  The intestines often can lose the ability to digest lactose when stressed. o Avoid foods that cause gassiness or bloating.  Typical foods include beans and other legumes, cabbage, broccoli, and dairy foods.  Every person has some sensitivity to other foods, so listen to our body and avoid those foods that trigger problems for you. o Adding fiber (Citrucel, Metamucil, psyllium, Miralax) gradually can help thicken stools by absorbing excess fluid and retrain the intestines to act more normally.  Slowly increase the dose over a few weeks.  Too much fiber too soon can backfire and cause cramping & bloating. o Probiotics (such as active yogurt, Align, etc) may help repopulate the intestines and colon with normal bacteria and calm down a sensitive digestive tract.  Most studies show it to be of mild help, though, and such products can be costly. o Medicines:   Bismuth subsalicylate (ex. Kayopectate, Pepto Bismol) every 30 minutes for up to 6 doses can help control diarrhea.  Avoid if pregnant.   Loperamide (Immodium) can slow down diarrhea.  Start with two tablets (4mg  total) first and then try one tablet every 6 hours.  Avoid if you are having fevers or severe pain.  If you are not better or start feeling worse, stop all medicines and call your doctor for advice o Call your doctor if you are getting worse or not better.  Sometimes further testing (cultures, endoscopy, X-ray studies, bloodwork, etc) may be needed to help diagnose and treat the cause of the diarrhea. o

## 2011-06-17 ENCOUNTER — Encounter (HOSPITAL_BASED_OUTPATIENT_CLINIC_OR_DEPARTMENT_OTHER): Payer: Self-pay | Admitting: Emergency Medicine

## 2011-06-17 ENCOUNTER — Emergency Department (HOSPITAL_BASED_OUTPATIENT_CLINIC_OR_DEPARTMENT_OTHER)
Admission: EM | Admit: 2011-06-17 | Discharge: 2011-06-17 | Disposition: A | Discharge status: Home / Self Care | Payer: 59 | Attending: Emergency Medicine | Admitting: Emergency Medicine

## 2011-06-17 DIAGNOSIS — IMO0002 Reserved for concepts with insufficient information to code with codable children: Secondary | ICD-10-CM | POA: Insufficient documentation

## 2011-06-17 DIAGNOSIS — Y838 Other surgical procedures as the cause of abnormal reaction of the patient, or of later complication, without mention of misadventure at the time of the procedure: Secondary | ICD-10-CM | POA: Insufficient documentation

## 2011-06-17 MED ORDER — DOCUSATE SODIUM 100 MG PO CAPS: 100.0000 mg | ORAL_CAPSULE | Freq: Two times a day (BID) | ORAL | Status: AC

## 2011-06-17 NOTE — ED Provider Notes (Signed)
History     CSN: [id number] Arrival date & time: 06/17/2011  2:22 AM  Chief Complaint  Patient presents with  . Hemorrhoids  . Rectal Bleeding   HPI Complains of rectal bleeding at site of hemorrhoid surgery. Patient underwent hemorrhoidectomy at [facility] last week bleeding has stopped since arrival here .states it was profuse earlier tonight. Denies fever denies abdominal pain denies lightheadedness complains of pain at surgical site. Has been prescribed oxycodone however he is hesitant to take it as he knows that it causes constipation  Past Medical History  Diagnosis Date  . Hemorrhoids   . Anal fissure     Past Surgical History  Procedure Date  . Elbow surgery     left  . Colonoscopy w/ biopsies 05/2011  . Excisional hemorrhoidectomy 06/13/2011    left posterolateral external hemorrhoidectomy    Family History  Problem Relation Age of Onset  . Adopted: Yes    History  Substance Use Topics  . Smoking status: Former Smoker -- 2 years    Types: Cigarettes, Cigars  . Smokeless tobacco: Never Used   Comment: 3 a week  . Alcohol Use: Yes     2-3 weekend drinks      Review of Systems  Constitutional: Negative.   HENT: Negative.   Respiratory: Negative.   Cardiovascular: Negative.   Gastrointestinal: Positive for anal bleeding and rectal pain.  Musculoskeletal: Negative.   Skin: Negative.   Neurological: Negative.   Hematological: Negative.   Psychiatric/Behavioral: Negative.     Physical Exam  BP 133/77  Pulse 58  Temp(Src) 98.5 F (36.9 C) (Oral)  Resp 16  SpO2 100%  Physical Exam  Constitutional: He appears well-developed and well-nourished.  HENT:  Head: Normocephalic and atraumatic.  Eyes: Conjunctivae are normal. Pupils are equal, round, and reactive to light.  Neck: Neck supple. No tracheal deviation present. No thyromegaly present.  Pulmonary/Chest: Effort normal.  Abdominal: Soft. Bowel sounds are normal. He exhibits no  distension. There is no tenderness.  Genitourinary:       Perianal area: There is proximally 1 cm open wound clean appearing no active bleeding minimally tender no redness no swelling  Musculoskeletal: Normal range of motion. He exhibits no edema and no tenderness.  Neurological: He is alert. Coordination normal.  Skin: Skin is warm and dry. No rash noted.  Psychiatric: He has a normal mood and affect.    ED Course  Procedures  MDM Postop bleeding, probably normal and expected. Plan prescription of Colace followup [place] surgery      Doug Sou, MD 06/17/11 [phone]

## 2011-06-17 NOTE — ED Notes (Signed)
Pt had hemorrhoid surgery tues. Tonight started having bleeding spontaneously.

## 2011-06-21 ENCOUNTER — Encounter (INDEPENDENT_AMBULATORY_CARE_PROVIDER_SITE_OTHER): Payer: Self-pay

## 2011-06-21 ENCOUNTER — Telehealth (INDEPENDENT_AMBULATORY_CARE_PROVIDER_SITE_OTHER): Payer: Self-pay

## 2011-06-21 NOTE — Telephone Encounter (Signed)
Patient called in and stated he was still having a little pain and bleeding still and so he was not able to go back to work today. I wrote a note for him to be out the rest of the week and for him to return on Monday.

## 2011-07-06 ENCOUNTER — Encounter (INDEPENDENT_AMBULATORY_CARE_PROVIDER_SITE_OTHER): Payer: Self-pay | Admitting: General Surgery

## 2011-07-06 ENCOUNTER — Ambulatory Visit (INDEPENDENT_AMBULATORY_CARE_PROVIDER_SITE_OTHER): Payer: 59 | Admitting: General Surgery

## 2011-07-06 VITALS — BP 120/82 | HR 80

## 2011-07-06 DIAGNOSIS — Z9889 Other specified postprocedural states: Secondary | ICD-10-CM

## 2011-07-06 DIAGNOSIS — Z8719 Personal history of other diseases of the digestive system: Secondary | ICD-10-CM

## 2011-07-06 DIAGNOSIS — Z09 Encounter for follow-up examination after completed treatment for conditions other than malignant neoplasm: Secondary | ICD-10-CM

## 2011-07-06 MED ORDER — OXYCODONE-ACETAMINOPHEN 7.5-325 MG PO TABS: 1.0000 | ORAL_TABLET | ORAL | Status: DC | PRN

## 2011-07-06 NOTE — Progress Notes (Signed)
Procedure: Exam under anesthesia, left posterior lateral excisional hemorrhoidectomy June 13 2011  History of Present Ilness: Comes in for 2nd postop appt.  Pain much better. No fever, chills, N/V/D/C. Having daily BMs.   Will have some discomfort for about 30 min after having a stool.  Still taking percocet once or twice a day. No dysuria. Returned to work.  Physical Exam: BP 120/82  Pulse 80  Well-developed well-nourished African American male in no apparent distress Pulmonary-lungs are clear to auscultation Cardiac-regular rate and rhythm Abdomen-soft nontender nondistended Rectal-incision in the left posterior lateral position,sutures have dissolved. Incision slightly opened. Skin separated by about 2 mm, length 1cm.  Wound clean with granulation tissue slightly raised. No cellulitis. No hematoma. No overt edema. Digital rectal exam was deferred   Assessment and Plan: Status post exam under anesthesia with excision of left posterior lateral external hemorrhoid & anal fissure  There are no signs of infection. I've encouraged to continue to drink 8-10 glasses of water a day. Also encouraged him to continue with warm water tub soaks  several times a day. Also told him he needs to take MiraLax or milk of magnesia if no bowel movements for 2 days. He was given a prescription for Percocet 7-1/2 mg.   I'll see him in 6 weeks.

## 2011-07-06 NOTE — Patient Instructions (Signed)
Continue to drink plenty of liquids. Take stool softners.  If no bowel movement for 2 days, take milk of magnesia or miralax

## 2011-07-12 ENCOUNTER — Telehealth: Payer: Self-pay | Admitting: Gastroenterology

## 2011-07-12 MED ORDER — ULTRAM 50 MG PO TABS: 50.0000 mg | ORAL_TABLET | Freq: Four times a day (QID) | ORAL | Status: DC | PRN

## 2011-07-12 NOTE — Telephone Encounter (Signed)
Ok to rx

## 2011-07-12 NOTE — Telephone Encounter (Addendum)
Pt with hx of rectal pain last seen here 05/27/11 for OV and had COLON on 05/24/11 . COLON showed probable healed fissure , no IBD and hyperplastic sessile polyp. He saw Dr Gaynelle Adu on 05/29/11 for rectal pain and an external hemorrhoid that he had excised on 06/13/11. 2nd post op visit 07/06/11 on 07/06/11 he was given 80 tabs of Percocet to take 1-2 q4hr prn for pain. Today, he called for a refill on Tramadol. I asked why he needed it when he had Percocet and he stated he can't take the Percocet and work and the Tramadol helps while he works. Can I refill his Tramadol? Thanks.

## 2011-07-12 NOTE — Telephone Encounter (Signed)
Notified pt Dr Jarold Motto refilled his ultram.

## 2011-08-23 ENCOUNTER — Encounter (INDEPENDENT_AMBULATORY_CARE_PROVIDER_SITE_OTHER): Payer: 59 | Admitting: General Surgery

## 2011-08-29 ENCOUNTER — Encounter (INDEPENDENT_AMBULATORY_CARE_PROVIDER_SITE_OTHER): Payer: Self-pay | Admitting: General Surgery

## 2011-11-23 IMAGING — CR DG HAND COMPLETE 3+V*R*
3 series · 3 of 3 positions shown · non-contrast
Comparison: None.

CLINICAL DATA: Right index finger laceration

RIGHT HAND - COMPLETE 3+ VIEW

[x hand pa right]
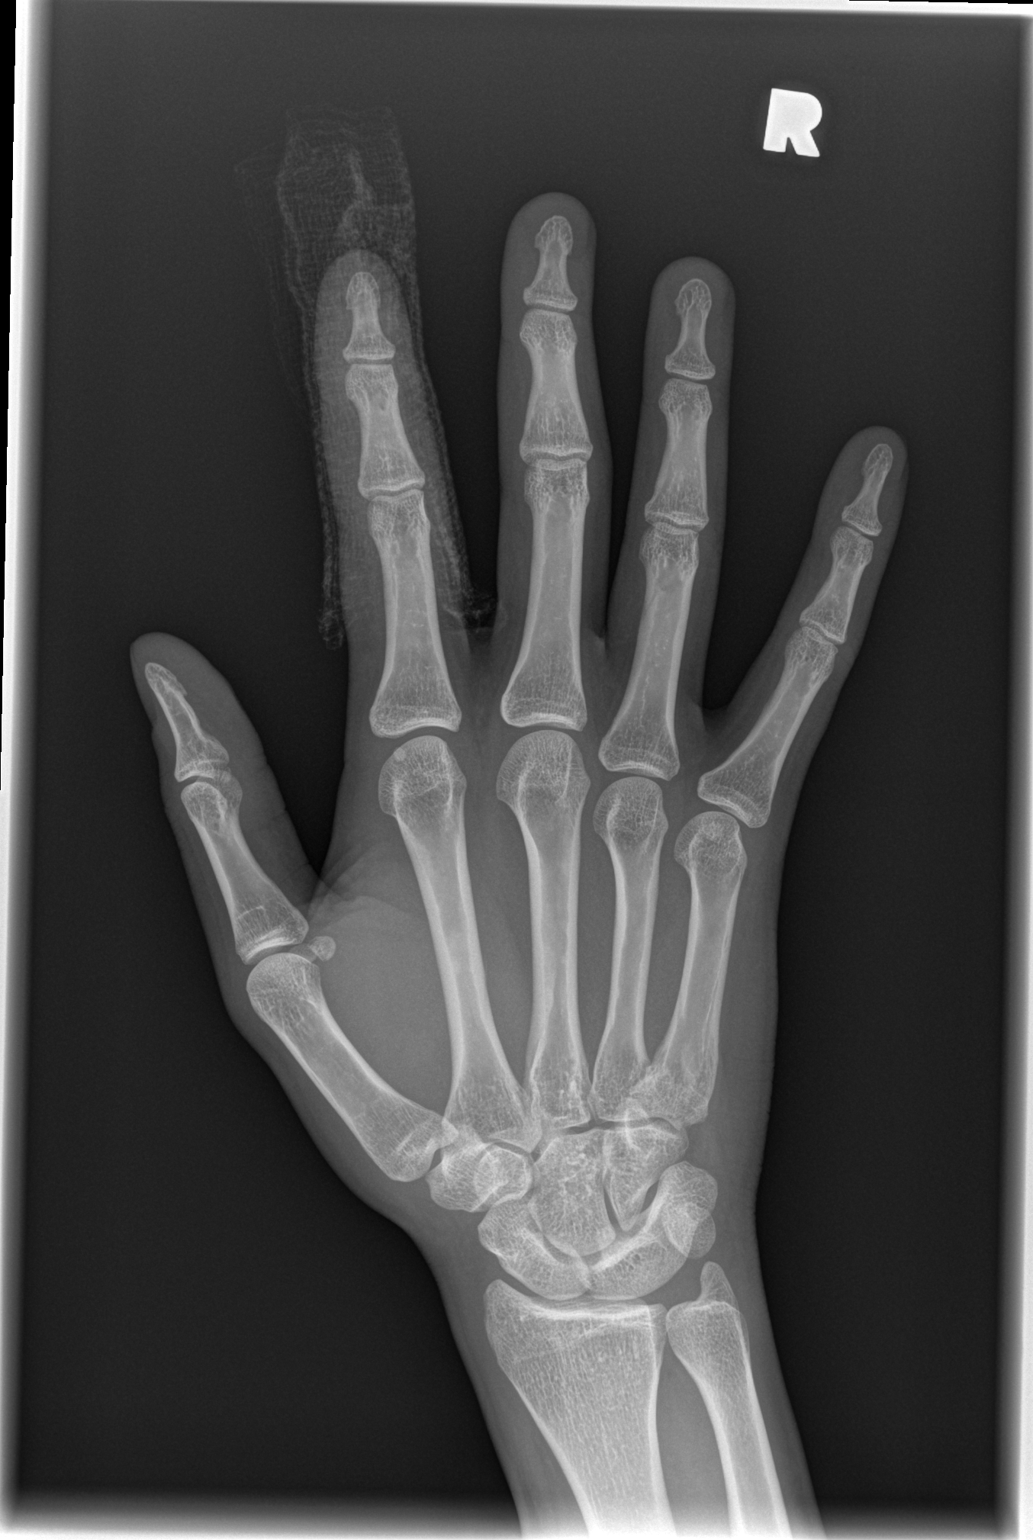

[x hand oblique right]
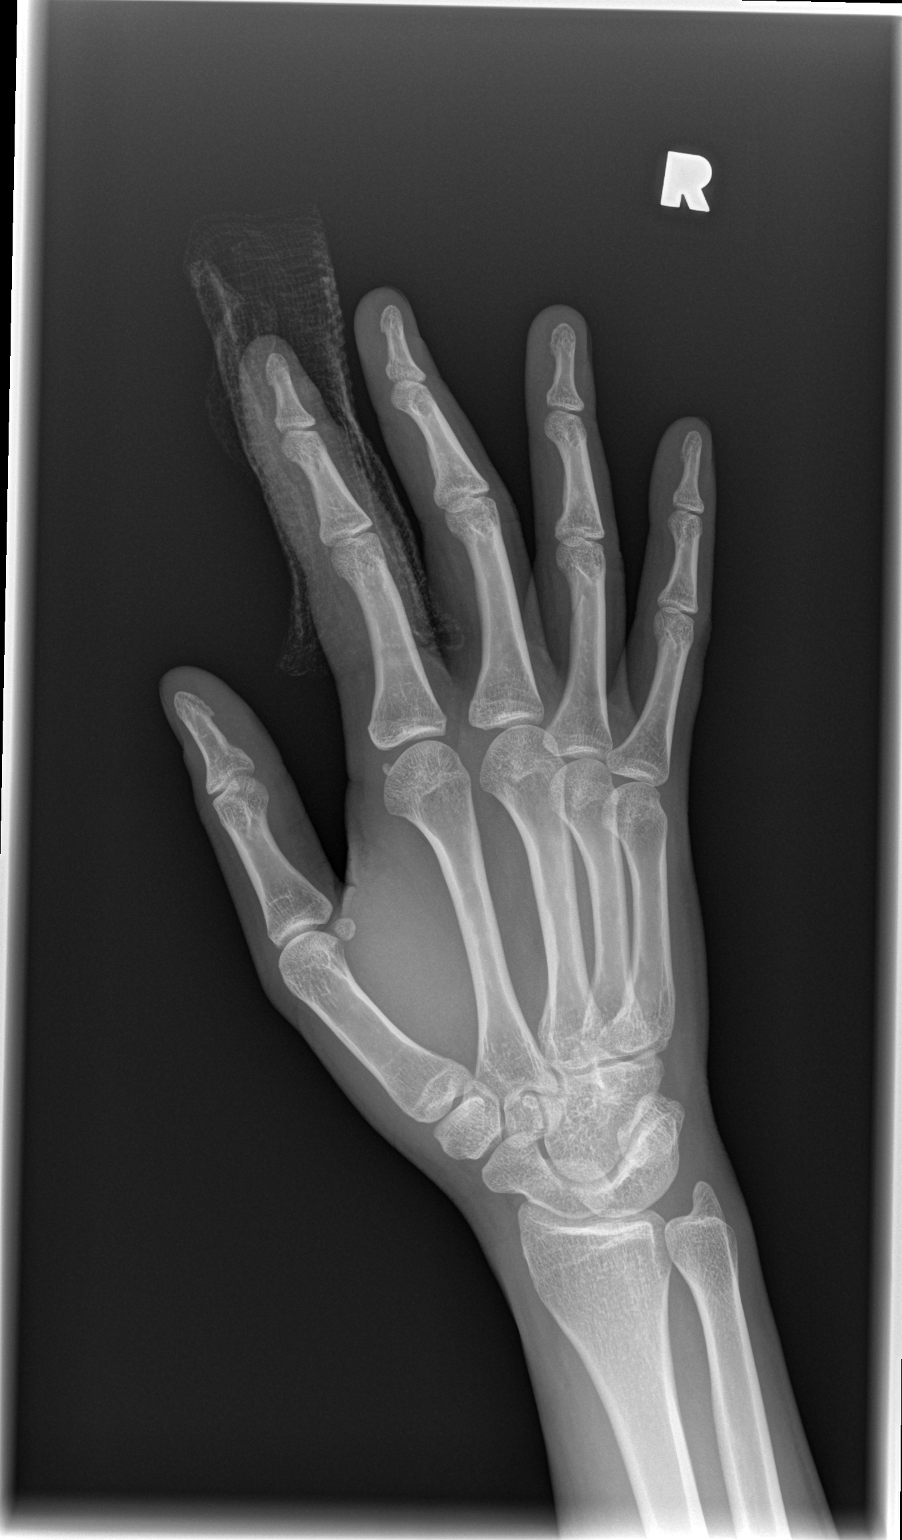

[x hand lat right]
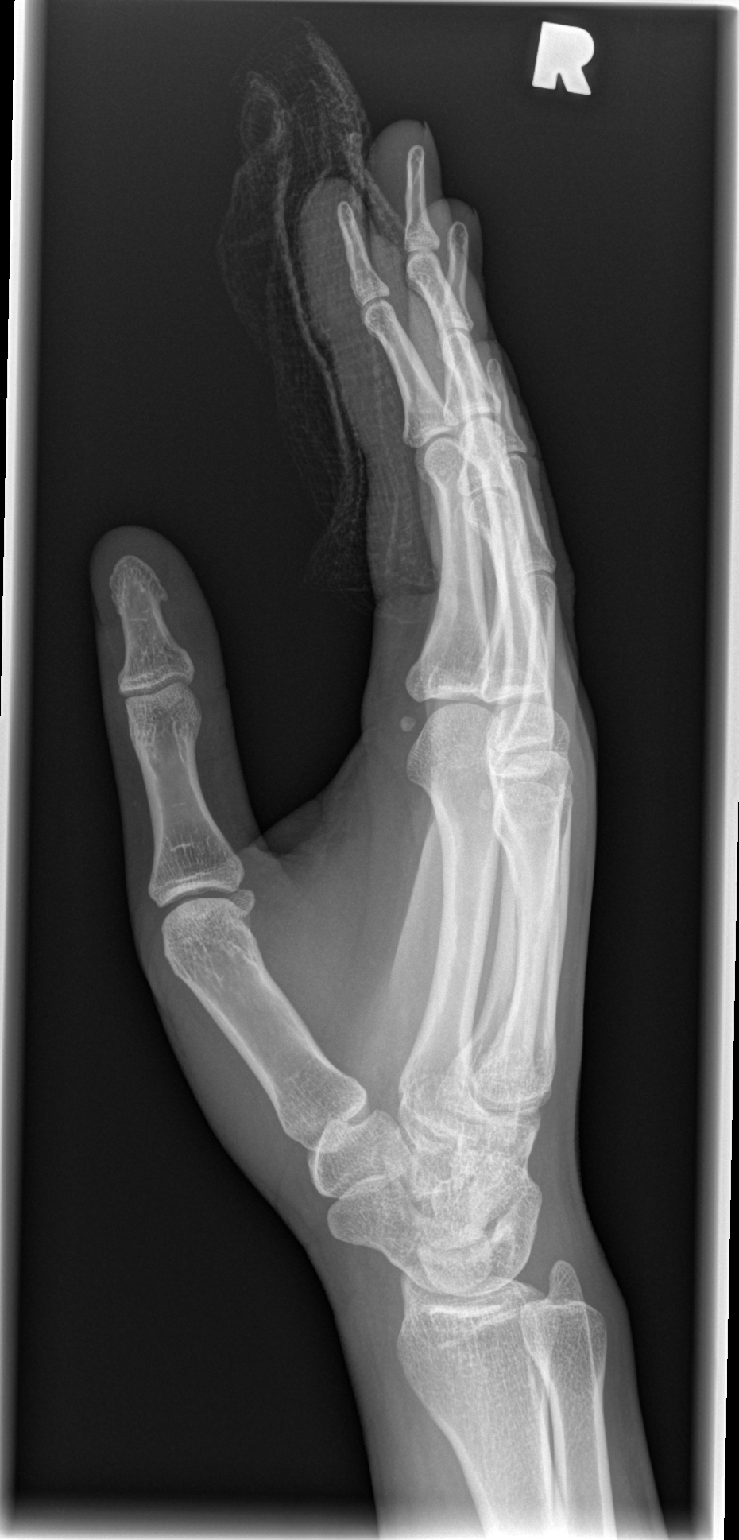

[3 of 3 positions shown; findings below may reference images not displayed]

FINDINGS: Three views of the right hand submitted.  Bandage
artifacts are noted right second finger.  No acute fracture or
subluxation.
IMPRESSION: No acute fracture or subluxation is identified.

## 2012-04-25 ENCOUNTER — Ambulatory Visit (INDEPENDENT_AMBULATORY_CARE_PROVIDER_SITE_OTHER): Payer: 59 | Admitting: General Surgery

## 2012-04-25 ENCOUNTER — Encounter (INDEPENDENT_AMBULATORY_CARE_PROVIDER_SITE_OTHER): Payer: Self-pay | Admitting: General Surgery

## 2012-04-25 VITALS — BP 102/60 | HR 68 | Temp 97.6°F | Resp 14 | Ht 69.0 in | Wt 133.0 lb

## 2012-04-25 DIAGNOSIS — K602 Anal fissure, unspecified: Secondary | ICD-10-CM

## 2012-04-25 MED ORDER — AMBULATORY NON FORMULARY MEDICATION: 1.0000 "application " | Freq: Two times a day (BID) | Status: AC

## 2012-04-25 MED ORDER — TRAMADOL HCL 50 MG PO TABS: 50.0000 mg | ORAL_TABLET | ORAL | Status: AC | PRN

## 2012-04-25 NOTE — Patient Instructions (Signed)
Anal Fissure, Adult An anal fissure is a small tear or crack in the skin around the anus. Bleeding from a fissure usually stops on its own within a few minutes. However, bleeding will often reoccur with each bowel movement until the crack heals.  CAUSES   Passing large, hard stools.   Frequent diarrheal stools.   Constipation.   Inflammatory bowel disease (Crohn's disease or ulcerative colitis).   Infections.   Anal sex.  SYMPTOMS   Small amounts of blood seen on your stools, on toilet paper, or in the toilet after a bowel movement.   Rectal bleeding.   Painful bowel movements.   Itching or irritation around the anus.  DIAGNOSIS Your caregiver will examine the anal area. An anal fissure can usually be seen with careful inspection. A rectal exam may be performed and a short tube (anoscope) may be used to examine the anal canal. TREATMENT   You may be instructed to take fiber supplements. These supplements can soften your stool to help make bowel movements easier.   Sitz baths may be recommended to help heal the tear. Do not use soap in the sitz baths.   A medicated cream or ointment may be prescribed to lessen discomfort.  HOME CARE INSTRUCTIONS   Maintain a diet high in fruits, whole grains, and vegetables. Avoid constipating foods like bananas and dairy products.   Slowly increase the fiber in your diet over the next several weeks (to avoid cramping)  Take sitz baths as directed by your caregiver.   Drink enough fluids to keep your urine clear or pale yellow.   Only take over-the-counter or prescription medicines for pain, discomfort, or fever as directed by your caregiver. Do not take aspirin as this may increase bleeding.   Do not use ointments containing numbing medications (anesthetics) or hydrocortisone. They could slow healing.  SEEK MEDICAL CARE IF:   Your fissure is not completely healed within 3 days.   You have further bleeding.   You have a fever.    You have diarrhea mixed with blood.   You have pain.   Your problem is getting worse rather than better.  MAKE SURE YOU:   Understand these instructions.   Will watch your condition.   Will get help right away if you are not doing well or get worse.  Document Released: 10/23/2005 Document Revised: 10/12/2011 Document Reviewed: 04/09/2011 [facility] Patient Information 2012 [place], [place].  Fiber Content in Foods Drinking plenty of fluids and consuming foods high in fiber can help with constipation. See the list below for the fiber content of some common foods. Starches and Grains / Dietary Fiber (g)  Cheerios, 1 cup / 3 g   Kellogg's Corn Flakes, 1 cup / 0.7 g   Rice Krispies, 1  cup / 0.3 g   Quaker Oat Life Cereal,  cup / 2.1 g   Oatmeal, instant (cooked),  cup / 2 g   Kellogg's Frosted Mini Wheats, 1 cup / 5.1 g   Rice, Lichty, long-grain (cooked), 1 cup / 3.5 g   Rice, white, long-grain (cooked), 1 cup / 0.6 g   Macaroni, cooked, enriched, 1 cup / 2.5 g  Legumes / Dietary Fiber (g)  Beans, baked, canned, plain or vegetarian,  cup / 5.2 g   Beans, kidney, canned,  cup / 6.8 g   Beans, pinto, dried (cooked),  cup / 7.7 g   Beans, pinto, canned,  cup / 5.5 g  Breads and Crackers / Dietary  Fiber (g)  Graham crackers, plain or honey, 2 squares / 0.7 g   Saltine crackers, 3 squares / 0.3 g   Pretzels, plain, salted, 10 pieces / 1.8 g   Bread, whole-wheat, 1 slice / 1.9 g   Bread, white, 1 slice / 0.7 g   Bread, raisin, 1 slice / 1.2 g   Bagel, plain, 3 oz / 2 g   Tortilla, flour, 1 oz / 0.9 g   Tortilla, corn, 1 small / 1.5 g   Bun, hamburger or hotdog, 1 small / 0.9 g  Fruits / Dietary Fiber (g)  Apple, raw with skin, 1 medium / 4.4 g   Applesauce, sweetened,  cup / 1.5 g   Banana,  medium / 1.5 g   Grapes, 10 grapes / 0.4 g   Orange, 1 small / 2.3 g   Raisin, 1.5 oz / 1.6 g   Melon, 1 cup / 1.4 g  Vegetables / Dietary Fiber  (g)  Green beans, canned,  cup / 1.3 g   Carrots (cooked),  cup / 2.3 g   Broccoli (cooked),  cup / 2.8 g   Peas, frozen (cooked),  cup / 4.4 g   Potatoes, mashed,  cup / 1.6 g   Lettuce, 1 cup / 0.5 g   Corn, canned,  cup / 1.6 g   Tomato,  cup / 1.1 g  Document Released: 03/11/2007 Document Revised: 10/12/2011 Document Reviewed: 05/06/2007 [facility] Patient Information 2012 [place], [place].

## 2012-04-25 NOTE — Progress Notes (Signed)
Subjective:     Patient ID: Jeremiah Griffith, male   DOB: [birth year], 21 y.o.   MRN: [id number]  HPI This is a 21 year old [place] American male who I initially saw in the summer of 2012. At that time he had an anal fissure which had been managed with diltiazem ointment as well as a left lateral external hemorrhoid. The patient was convinced that his pain was due to the external hemorrhoid swelling intermittently. He subsequently underwent exam under anesthesia, excision of left posterior lateral external hemorrhoid on August 7. He was last seen in the office on August 30. He comes in complaining of intermittent severe rectal pain. It will happen about 3 times a month. It will occur at random times of the day. It is not associated with bowel movements. The pain will be a 10 out of 10. He will generally take 3 Aleve and rests for  several hours. He states that the pain will gradually ease off. He denies any incontinence. He reports a bowel movement every day. He does report some occasional blood on the toilet paper. He denies straining. He denies any itching or burning. He reports having a bowel movement per day. He drinks about 4 bottles of water per day. He does not read while sitting on the commode.  PMHx, PSHx, SOCHx, FAMHx, ALL reviewed and unchanged   Review of Systems A 10 point review of systems was performed all systems are negative except for what is mentioned in the history of present illness    Objective:   Physical Exam BP 102/60  Pulse 68  Temp 97.6 F (36.4 C) (Temporal)  Resp 14  Ht 5\' 9"  (1.753 m)  Wt 133 lb (60.328 kg)  BMI 19.64 kg/m2  Gen: alert, NAD, non-toxic appearing Pupils: equal, no scleral icterus Pulm: Lungs clear to auscultation, symmetric chest rise CV: regular rate and rhythm Abd: soft, nontender, nondistended. Well-healed trocar sites. No cellulitis. No incisional hernia Ext: no edema, no calf tenderness Skin: no rash, no jaundice Rectal: gross inspection  reveals tear in posterior midline. DRE and anoscopy deferred secondary to fissure    Assessment:     Anal fissure    Plan:     As I suspected last summer, I still believe his underlying pain is due to his anal fissure.  We Re-discussed the etiology of anal fissures. The patient was given educational material as well as diagrams. We discussed nonoperative and operative management of anal fissures.  We discussed nonoperative management including correcting underlying bowel habits such as constipation, avoiding bathroom reading, avoiding straining with defecation. We also discussed the use of topical ointments such as nifedipine ointment. We also discussed the use of Botox injection.  With respect to surgical intervention, we discussed an anal sphincterotomy. I described how the procedure is performed. We also discussed the aftercare. We discussed the risk and benefits of surgery including but not limited to bleeding, infection, blood clot formation, general anesthesia risk, urinary retention, and the risk of incontinence. We discussed a 20-25% chance of incontinence to flatus, a 10-20% chance of incontinence to liquid stool, and a 5-10% chance of incontinence to solid stool. I explained that the percentages I quoted are from the literature and not from my personal practice experience.  My recommendation was to start with non-operative management first.  The patient has elected To start with nonoperative management which includes nifedipine ointment twice a day, sitz baths, slowly starting a high fiber diet, and increasing his water intake. He was  given a prescription for Ultram. Followup in 6-8 weeks  Mary Sella. Andrey Campanile, MD, FACS General, Bariatric, & Minimally Invasive Surgery [facility], [place]

## 2012-06-13 ENCOUNTER — Encounter (INDEPENDENT_AMBULATORY_CARE_PROVIDER_SITE_OTHER): Payer: 59 | Admitting: General Surgery

## 2012-08-02 ENCOUNTER — Encounter (INDEPENDENT_AMBULATORY_CARE_PROVIDER_SITE_OTHER): Payer: 59 | Admitting: General Surgery

## 2012-08-07 ENCOUNTER — Encounter (INDEPENDENT_AMBULATORY_CARE_PROVIDER_SITE_OTHER): Payer: Self-pay | Admitting: General Surgery

## 2012-11-07 ENCOUNTER — Encounter (HOSPITAL_BASED_OUTPATIENT_CLINIC_OR_DEPARTMENT_OTHER): Payer: Self-pay | Admitting: *Deleted

## 2012-11-07 ENCOUNTER — Emergency Department (HOSPITAL_BASED_OUTPATIENT_CLINIC_OR_DEPARTMENT_OTHER)
Admission: EM | Admit: 2012-11-07 | Discharge: 2012-11-07 | Discharge status: Against Medical Advice | Payer: 59 | Attending: Emergency Medicine | Admitting: Emergency Medicine

## 2012-11-07 DIAGNOSIS — R259 Unspecified abnormal involuntary movements: Secondary | ICD-10-CM | POA: Insufficient documentation

## 2012-11-07 DIAGNOSIS — T50995A Adverse effect of other drugs, medicaments and biological substances, initial encounter: Secondary | ICD-10-CM | POA: Insufficient documentation

## 2012-11-07 NOTE — ED Notes (Signed)
No answer when called in waiting room x 3

## 2012-11-07 NOTE — ED Notes (Signed)
Took his brothers Celexa a few days ago and now he feels shaky and sleepy.

## 2013-05-16 ENCOUNTER — Ambulatory Visit (INDEPENDENT_AMBULATORY_CARE_PROVIDER_SITE_OTHER): Payer: 59 | Admitting: General Surgery

## 2013-05-28 ENCOUNTER — Encounter (INDEPENDENT_AMBULATORY_CARE_PROVIDER_SITE_OTHER): Payer: 59 | Admitting: General Surgery

## 2016-02-20 ENCOUNTER — Emergency Department (HOSPITAL_COMMUNITY): Payer: Federal, State, Local not specified - PPO

## 2016-02-20 ENCOUNTER — Encounter (HOSPITAL_COMMUNITY): Payer: Self-pay | Admitting: Emergency Medicine

## 2016-02-20 ENCOUNTER — Emergency Department (HOSPITAL_COMMUNITY)
Admission: EM | Admit: 2016-02-20 | Discharge: 2016-02-20 | Disposition: A | Discharge status: Home / Self Care | Payer: Federal, State, Local not specified - PPO | Attending: Emergency Medicine | Admitting: Emergency Medicine

## 2016-02-20 DIAGNOSIS — M25572 Pain in left ankle and joints of left foot: Secondary | ICD-10-CM | POA: Diagnosis present

## 2016-02-20 DIAGNOSIS — Z87891 Personal history of nicotine dependence: Secondary | ICD-10-CM | POA: Insufficient documentation

## 2016-02-20 NOTE — ED Provider Notes (Signed)
CSN: [id number]     Arrival date & time 02/20/16  [id number] History   First MD Initiated Contact with Patient 02/20/16 [phone]     Chief Complaint  Patient presents with  . Ankle Pain      HPI Pt was seen at 0710. Per pt, c/o gradual onset and persistence of constant left ankle "pain" that began yesterday. Pt states he was playing soccer on uneven ground when he "rolled" his left ankle. Pt states he was unable to walk on his left leg this morning due to increasing pain in his left ankle. Denies any other injuries. Denies hip/knee/foot pain, no back pain, no fall, no focal motor weakness, no tingling/numbness in extremities.    Past Medical History  Diagnosis Date  . Hemorrhoids   . Anal fissure    Past Surgical History  Procedure Laterality Date  . Elbow surgery      left  . Colonoscopy w/ biopsies  05/2011  . Excisional hemorrhoidectomy  06/13/2011    left posterolateral external hemorrhoidectomy   Family History  Problem Relation Age of Onset  . Adopted: Yes   Social History  Substance Use Topics  . Smoking status: Former Smoker -- 2 years    Types: Cigarettes, Cigars  . Smokeless tobacco: Never Used     Comment: 3 a week  . Alcohol Use: Yes     Comment: 2-3 weekend drinks    Review of Systems ROS: Statement: All systems negative except as marked or noted in the HPI; Constitutional: Negative for fever and chills. ; ; Eyes: Negative for eye pain, redness and discharge. ; ; ENMT: Negative for ear pain, hoarseness, nasal congestion, sinus pressure and sore throat. ; ; Cardiovascular: Negative for chest pain, palpitations, diaphoresis, dyspnea and peripheral edema. ; ; Respiratory: Negative for cough, wheezing and stridor. ; ; Gastrointestinal: Negative for nausea, vomiting, diarrhea, abdominal pain, blood in stool, hematemesis, jaundice and rectal bleeding. . ; ; Genitourinary: Negative for dysuria, flank pain and hematuria. ; ; Musculoskeletal: Negative for back pain and neck pain.  +left ankle pain, swelling and trauma.; ; Skin: Negative for pruritus, rash, abrasions, blisters, bruising and skin lesion.; ; Neuro: Negative for headache, lightheadedness and neck stiffness. Negative for weakness, altered level of consciousness , altered mental status, extremity weakness, paresthesias, involuntary movement, seizure and syncope.      Allergies  Penicillins  Home Medications   Prior to Admission medications   Medication Sig Start Date End Date Taking? Authorizing Provider  AMBULATORY NON FORMULARY MEDICATION Place 1 application around the anus 2 (two) times daily. Medication Name: Nifedipine 2% ointment 04/25/12   Greer Pickerel, MD  Divalproex Sodium (DEPAKOTE PO) Take by mouth.    Historical Provider, MD  Naproxen Sodium (ALEVE PO) Take by mouth as needed.    Historical Provider, MD   BP 123/94 mmHg  Pulse 80  Temp(Src) 97.8 F (36.6 C) (Oral)  Resp 18  Ht 5\' 9"  (1.753 m)  Wt 145 lb (65.772 kg)  BMI 21.40 kg/m2  SpO2 100% Physical Exam  0715: Physical examination:  Nursing notes reviewed; Vital signs and O2 SAT reviewed;  Constitutional: Well developed, Well nourished, Well hydrated, In no acute distress; Head:  Normocephalic, atraumatic; Eyes: EOMI, PERRL, No scleral icterus; ENMT: Mouth and pharynx normal, Mucous membranes moist; Neck: Supple, Full range of motion; Cardiovascular: Regular rate and rhythm; Respiratory: Breath sounds clear, No wheezes.  Speaking full sentences with ease, Normal respiratory effort/excursion; Chest: No deformity, Movement normal; Abdomen: Nondistended; Extremities:  No deformity. NT left knee/foot/toes. +tender to palp left lateral maleolar area w/localized edema, NMS intact left foot, strong pedal pp, LE muscle compartments soft.  No left proximal fibular head tenderness, no knee tenderness, no foot tenderness.  No deformity, no ecchymosis, no open wounds.  +plantarflexion of left foot w/calf squeeze.  No palpable gap left Achilles's tendon.   Decreased ROM F/E d/t pain.; Neuro: AA&Ox3, Major CN grossly intact.  Speech clear. No gross focal motor deficits in extremities.; Skin: Color normal, Warm, Dry.; Psych:  Affect flat.     ED Course  Procedures (including critical care time) Labs Review   Imaging Review  I have personally reviewed and evaluated these images and lab results as part of my medical decision-making.   EKG Interpretation None      MDM  MDM Reviewed: previous chart, nursing note and vitals Interpretation: x-ray   Dg Ankle Complete Left 02/20/2016  CLINICAL DATA:  Soccer EXAM: LEFT ANKLE COMPLETE - 3+ VIEW COMPARISON:  None. FINDINGS: Mild diffuse soft tissue swelling, most pronounced laterally. Probable small effusion. No fracture or dislocation seen. IMPRESSION: No fracture.  Probable small effusion. Electronically Signed   By: Claudie Revering M.D.   On: 02/20/2016 07:29    0745:  XR as above. Tx symptomatically, f/u Ortho MD. Dx and testing d/w pt and friend.  Questions answered.  Verb understanding, agreeable to d/c home with outpt f/u.   Francine Graven, DO 02/24/16 [phone]

## 2016-02-20 NOTE — ED Notes (Addendum)
Per pt yesterday he was playing soccer on uneven ground and noticed pain in his lateral left ankle, with increasing pain last night. Patient states he attempted wrapping it with an ace bandage but that increased the pain.  Patient states that he is unable to put any pressure on it this morning.

## 2016-02-20 NOTE — Discharge Instructions (Signed)
Take over the counter tylenol and ibuprofen, as directed on packaging, with food, as needed for pain.  Use crutches and wear the splint until you are seen in follow up. Call the [facility] tomorrow to schedule a follow up appointment in the next 3 days.  Return to the Emergency Department immediately if worsening.

## 2020-09-20 ENCOUNTER — Ambulatory Visit (HOSPITAL_COMMUNITY)
Admission: EM | Admit: 2020-09-20 | Discharge: 2020-09-20 | Disposition: A | Discharge status: Home / Self Care | Payer: 59 | Attending: Family Medicine | Admitting: Family Medicine

## 2020-09-20 ENCOUNTER — Other Ambulatory Visit: Payer: Self-pay

## 2020-09-20 ENCOUNTER — Encounter (HOSPITAL_COMMUNITY): Payer: Self-pay

## 2020-09-20 DIAGNOSIS — H60392 Other infective otitis externa, left ear: Secondary | ICD-10-CM | POA: Diagnosis not present

## 2020-09-20 DIAGNOSIS — H9202 Otalgia, left ear: Secondary | ICD-10-CM

## 2020-09-20 MED ORDER — NEOMYCIN-POLYMYXIN-HC 3.5-10000-1 OT SUSP: 3.0000 [drp] | Freq: Three times a day (TID) | OTIC | 0 refills | Status: DC

## 2020-09-20 MED ORDER — CIPROFLOXACIN HCL 500 MG PO TABS: 500.0000 mg | ORAL_TABLET | Freq: Two times a day (BID) | ORAL | 0 refills | Status: DC

## 2020-09-20 MED ORDER — HYDROCODONE-ACETAMINOPHEN 5-325 MG PO TABS: 1.0000 | ORAL_TABLET | Freq: Four times a day (QID) | ORAL | 0 refills | Status: DC | PRN

## 2020-09-20 NOTE — ED Triage Notes (Signed)
Pt presents with ear pain and a headache x 1 week. He states he feels a lot of pressure and discomfort. Pt states he has not taken medicine to relieve the pain. Pt states he feels like there is water inside of his ear.

## 2020-09-20 NOTE — Discharge Instructions (Signed)
Take the antibiotic 2 times a day Use the eardrops 3 times a day You may stop the eardrops in 5 to 7 days You should see improvement within a day or 2 Take over-the-counter medicines for moderate pain Take hydrocodone for severe pain.  Do not drive on hydrocodone

## 2020-09-20 NOTE — ED Provider Notes (Signed)
[place]    CSN: [id number] Arrival date & time: 09/20/20  1904      History   Chief Complaint Chief Complaint  Patient presents with  . Otalgia    left side    HPI Jeremiah Griffith is a 29 y.o. male.   HPI  Ear pain worsening over the last few days, since yesterday the pain is "severe".  He states that he can hardly hear out of the ear.  Feels swelling.  Feels painful.  Hurts when he chews on that side.  He is used no over-the-counter medicines.  No allergies.  No runny or stuffy nose.  No sinus drainage.  No sore throat.  No fever  Past Medical History:  Diagnosis Date  . Anal fissure   . Hemorrhoids     Patient Active Problem List   Diagnosis Date Noted  . Anal or rectal pain 05/24/2011  . Anal fissure 05/24/2011  . Rectal pain 05/17/2011  . 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 Constipation, chronic 05/17/2011  . Hemorrhoids 04/14/2011  . Constipation 04/14/2011  . 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 COLONOSCOPY W/ BIOPSIES  05/2011  . ELBOW SURGERY     left  . EXCISIONAL HEMORRHOIDECTOMY  06/13/2011   left posterolateral external hemorrhoidectomy       Home Medications    Prior to Admission medications   Medication Sig Start Date End Date Taking? Authorizing Provider  AMBULATORY NON FORMULARY MEDICATION Place 1 application around the anus 2 (two) times daily. Medication Name: Nifedipine 2% ointment 04/25/12   Greer Pickerel, MD  ciprofloxacin (CIPRO) 500 MG tablet Take 1 tablet (500 mg total) by mouth 2 (two) times daily. 09/20/20   Raylene Everts, MD  Divalproex Sodium (DEPAKOTE PO) Take by mouth.    [provider]  HYDROcodone-acetaminophen (NORCO/VICODIN) 5-325 MG tablet Take 1-2 tablets by mouth every 6 (six) hours as needed. 09/20/20   Raylene Everts, MD  Naproxen Sodium (ALEVE PO) Take by mouth as needed.    [provider]    neomycin-polymyxin-hydrocortisone (CORTISPORIN) 3.5-10000-1 OTIC suspension Place 3 drops into the left ear 3 (three) times daily. 09/20/20   Raylene Everts, MD    Family History Family History  Adopted: Yes    Social History Social History   Tobacco Use  . Smoking status: Former Smoker    Years: 2.00    Types: Cigarettes, Cigars  . Smokeless tobacco: Never Used  . Tobacco comment: 3 a week  Substance Use Topics  . Alcohol use: Yes    Comment: 2-3 weekend drinks  . Drug use: No     Allergies   Doxycycline and Penicillins   Review of Systems Review of Systems See HPI  Physical Exam Triage Vital Signs ED Triage Vitals  Enc Vitals Group     BP 09/20/20 2000 131/90     Pulse Rate 09/20/20 2000 71     Resp 09/20/20 2000 16     Temp 09/20/20 2000 98.6 F (37 C)     Temp Source 09/20/20 2000 Oral     SpO2 09/20/20 2000 99 %     Weight --      Height --      Head Circumference --      Peak Flow --      Pain Score 09/20/20 1955 9     Pain Loc --      Pain Edu? --  Excl. in GC? --    No data found.  Updated Vital Signs BP 131/90   Pulse 71   Temp 98.6 F (37 C) (Oral)   Resp 16   SpO2 99%      Physical Exam Constitutional:      General: He is not in acute distress.    Appearance: He is well-developed.  HENT:     Head: Normocephalic and atraumatic.     Right Ear: Tympanic membrane, ear canal and external ear normal. There is no impacted cerumen.     Left Ear: Tympanic membrane normal. There is no impacted cerumen.     Ears:     Comments: The left EAC is erythematous, macerated, swollen.  There is some yellow discharge.  There is pain with traction of the pinna.  Portion of TM is visible and is clear    Nose: Nose normal. No congestion.     Mouth/Throat:     Mouth: Mucous membranes are moist.     Pharynx: No posterior oropharyngeal erythema.  Eyes:     Conjunctiva/sclera: Conjunctivae normal.     Pupils: Pupils are equal, round, and  reactive to light.  Cardiovascular:     Rate and Rhythm: Normal rate.  Pulmonary:     Effort: Pulmonary effort is normal. No respiratory distress.  Abdominal:     Palpations: Abdomen is soft.  Musculoskeletal:        General: Normal range of motion.     Cervical back: Normal range of motion.  Lymphadenopathy:     Cervical: No cervical adenopathy.  Skin:    General: Skin is warm and dry.  Neurological:     Mental Status: He is alert.  Psychiatric:        Behavior: Behavior normal.      UC Treatments / Results  Labs (all labs ordered are listed, but only abnormal results are displayed) Labs Reviewed - No data to display  EKG   Radiology No results found.  Procedures Procedures (including critical care time)  Medications Ordered in UC Medications - No data to display  Initial Impression / Assessment and Plan / UC Course  I have reviewed the triage vital signs and the nursing notes.  Pertinent labs & imaging results that were available during my care of the patient were reviewed by me and considered in my medical decision making (see chart for details).     Painful otitis externa Final Clinical Impressions(s) / UC Diagnoses   Final diagnoses:  Otalgia of left ear  Other infective acute otitis externa of left ear     Discharge Instructions     Take the antibiotic 2 times a day Use the eardrops 3 times a day You may stop the eardrops in 5 to 7 days You should see improvement within a day or 2 Take over-the-counter medicines for moderate pain Take hydrocodone for severe pain.  Do not drive on hydrocodone   ED Prescriptions    Medication Sig Dispense Auth. Provider   ciprofloxacin (CIPRO) 500 MG tablet Take 1 tablet (500 mg total) by mouth 2 (two) times daily. 14 tablet Raylene Everts, MD   neomycin-polymyxin-hydrocortisone (CORTISPORIN) 3.5-10000-1 OTIC suspension Place 3 drops into the left ear 3 (three) times daily. 10 mL Raylene Everts, MD    HYDROcodone-acetaminophen (NORCO/VICODIN) 5-325 MG tablet Take 1-2 tablets by mouth every 6 (six) hours as needed. 10 tablet Raylene Everts, MD     I have reviewed the PDMP during this encounter.  Raylene Everts, MD 09/20/20 2019

## 2021-01-03 ENCOUNTER — Encounter (HOSPITAL_COMMUNITY): Payer: Self-pay | Admitting: Emergency Medicine

## 2021-01-03 ENCOUNTER — Other Ambulatory Visit: Payer: Self-pay

## 2021-01-03 ENCOUNTER — Ambulatory Visit (INDEPENDENT_AMBULATORY_CARE_PROVIDER_SITE_OTHER): Payer: BLUE CROSS/BLUE SHIELD

## 2021-01-03 ENCOUNTER — Ambulatory Visit (HOSPITAL_COMMUNITY)
Admission: EM | Admit: 2021-01-03 | Discharge: 2021-01-03 | Disposition: A | Discharge status: Home / Self Care | Payer: BLUE CROSS/BLUE SHIELD | Attending: Urgent Care | Admitting: Urgent Care

## 2021-01-03 DIAGNOSIS — M549 Dorsalgia, unspecified: Secondary | ICD-10-CM

## 2021-01-03 DIAGNOSIS — R0789 Other chest pain: Secondary | ICD-10-CM

## 2021-01-03 DIAGNOSIS — R079 Chest pain, unspecified: Secondary | ICD-10-CM

## 2021-01-03 MED ORDER — OMEPRAZOLE 20 MG PO CPDR: 20.0000 mg | DELAYED_RELEASE_CAPSULE | Freq: Two times a day (BID) | ORAL | 0 refills | Status: AC

## 2021-01-03 MED ORDER — LIDOCAINE VISCOUS HCL 2 % MT SOLN
15.0000 mL | Freq: Once | OROMUCOSAL | Status: AC
  Administered 2021-01-03: 15 mL via ORAL

## 2021-01-03 MED ORDER — ALUM & MAG HYDROXIDE-SIMETH 200-200-20 MG/5ML PO SUSP
30.0000 mL | Freq: Once | ORAL | Status: AC
  Administered 2021-01-03: 30 mL via ORAL

## 2021-01-03 MED ORDER — LIDOCAINE VISCOUS HCL 2 % MT SOLN
OROMUCOSAL | Status: AC
  Filled 2021-01-03: qty 15

## 2021-01-03 MED ORDER — ALUM & MAG HYDROXIDE-SIMETH 200-200-20 MG/5ML PO SUSP
ORAL | Status: AC
  Filled 2021-01-03: qty 30

## 2021-01-03 NOTE — ED Provider Notes (Signed)
[place]   MRN: [id number] DOB: 05/15/1991  Subjective:   Jeremiah Griffith is a 30 y.o. male presenting for 4-day history of acute onset persistent midsternal chest pain that radiates to the back.  Reports symptoms are moderate to severe in nature.  Symptoms are worse when he lays down.  Denies fever, headache, confusion, nausea, vomiting, diaphoresis, radiation of his pain into the neck, jaw or left arm.  Denies any history of heart conditions.  Denies any family history of the same.  No history of clotting disorders.  Patient eats takeout double times a week.  Smokes marijuana multiple times a day.  No current facility-administered medications for this encounter.  Current Outpatient Medications:  .  AMBULATORY NON FORMULARY MEDICATION, Place 1 application around the anus 2 (two) times daily. Medication Name: Nifedipine 2% ointment, Disp: 30 g, Rfl: 3 .  ciprofloxacin (CIPRO) 500 MG tablet, Take 1 tablet (500 mg total) by mouth 2 (two) times daily., Disp: 14 tablet, Rfl: 0 .  Divalproex Sodium (DEPAKOTE PO), Take by mouth., Disp: , Rfl:  .  HYDROcodone-acetaminophen (NORCO/VICODIN) 5-325 MG tablet, Take 1-2 tablets by mouth every 6 (six) hours as needed., Disp: 10 tablet, Rfl: 0 .  Naproxen Sodium (ALEVE PO), Take by mouth as needed., Disp: , Rfl:  .  neomycin-polymyxin-hydrocortisone (CORTISPORIN) 3.5-10000-1 OTIC suspension, Place 3 drops into the left ear 3 (three) times daily., Disp: 10 mL, Rfl: 0   Allergies  Allergen Reactions  . Doxycycline Rash  . Penicillins Rash    Happened when patient was a child.    Past Medical History:  Diagnosis Date  . Anal fissure   . Hemorrhoids      Past Surgical History:  Procedure Laterality Date  . COLONOSCOPY W/ BIOPSIES  05/2011  . ELBOW SURGERY     left  . EXCISIONAL HEMORRHOIDECTOMY  06/13/2011   left posterolateral external hemorrhoidectomy    Family History  Adopted: Yes    Social History   Tobacco Use  .  Smoking status: Former Smoker    Years: 2.00    Types: Cigarettes, Cigars  . Smokeless tobacco: Never Used  . Tobacco comment: 3 a week  Substance Use Topics  . Alcohol use: Yes    Comment: 2-3 weekend drinks  . Drug use: No    ROS   Objective:   Vitals: BP (!) 153/76 (BP Location: Left Arm)   Pulse 67   Temp 98.2 F (36.8 C) (Oral)   Resp 17   SpO2 100%   Physical Exam Constitutional:      General: He is not in acute distress.    Appearance: Normal appearance. He is well-developed. He is not ill-appearing, toxic-appearing or diaphoretic.  HENT:     Head: Normocephalic and atraumatic.     Right Ear: External ear normal.     Left Ear: External ear normal.     Nose: Nose normal.     Mouth/Throat:     Mouth: Mucous membranes are moist.     Pharynx: Oropharynx is clear.  Eyes:     General: No scleral icterus.    Extraocular Movements: Extraocular movements intact.     Pupils: Pupils are equal, round, and reactive to light.  Cardiovascular:     Rate and Rhythm: Normal rate and regular rhythm.     Heart sounds: Normal heart sounds. No murmur heard. No friction rub. No gallop.   Pulmonary:     Effort: Pulmonary effort is normal. No  respiratory distress.     Breath sounds: Normal breath sounds. No stridor. No wheezing, rhonchi or rales.  Chest:     Chest wall: Tenderness (Reproducible over the mid sternum and also when he lays down) present.  Abdominal:     General: Bowel sounds are normal. There is no distension.     Palpations: Abdomen is soft. There is no mass.     Tenderness: There is no abdominal tenderness. There is no right CVA tenderness, left CVA tenderness, guarding or rebound.  Neurological:     Mental Status: He is alert and oriented to person, place, and time.  Psychiatric:        Mood and Affect: Mood normal.        Behavior: Behavior normal.        Thought Content: Thought content normal.        Judgment: Judgment normal.     DG Chest 2  View  Result Date: 01/03/2021 CLINICAL DATA:  Central chest and back pain since Thursday morning EXAM: CHEST - 2 VIEW COMPARISON:  None FINDINGS: Normal heart size, mediastinal contours, and pulmonary vascularity. Lungs clear. No pleural effusion or pneumothorax. Bones unremarkable. IMPRESSION: Normal exam. Electronically Signed   By: Lavonia Dana M.D.   On: 01/03/2021 16:57   ED ECG REPORT   Date: 01/03/2021  Rate: 63 bpm  Rhythm: normal sinus rhythm  QRS Axis: normal  Intervals: normal  ST/T Wave abnormalities: nonspecific T wave changes  Conduction Disutrbances:none  Narrative Interpretation: Sinus rhythm at 63 bpm with nonspecific T wave flattening in lead aVL.  No previous EKG for comparison.  Old EKG Reviewed: none available  I have personally reviewed the EKG tracing and agree with the computerized printout as noted.   Assessment and Plan :   PDMP not reviewed this encounter.  1. Atypical chest pain     Suspect to contributing factors including daily marijuana use, acid reflux.  EKG and chest x-ray reassuring.  Patient has low risk factors for MI, chest PE.  He was given GI cocktail in clinic, start omeprazole as an outpatient.  Recommended lifestyle changes including cutting back dramatically on his marijuana use, eating much healthier.  Maintain strict ER precautions. Counseled patient on potential for adverse effects with medications prescribed today, patient verbalized understanding.    Jaynee Eagles, PA-C 01/03/21 1750

## 2021-01-03 NOTE — ED Triage Notes (Signed)
Pt states that he is having centralized chest pain and back pain that started Thursday morning.

## 2021-07-13 ENCOUNTER — Encounter: Payer: Self-pay | Admitting: Gastroenterology

## 2024-02-18 ENCOUNTER — Ambulatory Visit (INDEPENDENT_AMBULATORY_CARE_PROVIDER_SITE_OTHER)

## 2024-02-18 ENCOUNTER — Ambulatory Visit
Admission: EM | Admit: 2024-02-18 | Discharge: 2024-02-18 | Disposition: A | Discharge status: Home / Self Care | Attending: Family Medicine | Admitting: Family Medicine

## 2024-02-18 DIAGNOSIS — S20212A Contusion of left front wall of thorax, initial encounter: Secondary | ICD-10-CM | POA: Diagnosis not present

## 2024-02-18 DIAGNOSIS — R0781 Pleurodynia: Secondary | ICD-10-CM

## 2024-02-18 NOTE — ED Provider Notes (Signed)
 [facility]    CSN: [id number] Arrival date & time: 02/18/24  1056      History   Chief Complaint Chief Complaint  Patient presents with   Rib Injury    HPI Jeremiah Griffith is a 33 y.o. male presents for rib pain.  Patient reports yesterday he slipped on some rocks while crossing a creek landing onto his left lateral ribs.  Denies head injury or LOC.  Since then reports left lateral rib pain with deep breathing, laughing, movement.  Denies any bruising, swelling, hemoptysis, shortness of breath, back pain, chest pain, shoulder pain.  Does not take any blood thinning medications.  No history of rib fractures in the past.  He has not taken any OTC medications for symptoms.  No other concerns at this time.  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 Authorizing Provider  QUEtiapine (SEROQUEL) 25 MG tablet Take 12.5-50 mg by mouth at bedtime as needed.   Yes [provider]  AMBULATORY NON FORMULARY MEDICATION Place 1 application around the anus 2 (two) times daily. Medication Name: Nifedipine 2% ointment 04/25/12   Gaynelle Adu, MD  Divalproex Sodium (DEPAKOTE PO) Take by mouth.    [provider]  Naproxen Sodium (ALEVE PO) Take by mouth as needed.    [provider]  omeprazole (PRILOSEC) 20 MG  capsule Take 1 capsule (20 mg total) by mouth 2 (two) times daily before a meal. 01/03/21   Wallis Bamberg, PA-C    Family History Family History  Adopted: Yes    Social History Social History   Tobacco Use   Smoking status: Former    Types: Cigarettes, Cigars   Smokeless tobacco: Never   Tobacco comments:    3 a week  Vaping Use   Vaping status: Every Day  Substance Use Topics   Alcohol use: Yes    Comment: 2-3 weekend drinks   Drug use: No     Allergies   Doxycycline and Penicillins   Review of Systems Review of Systems  Musculoskeletal:        Left rib pain      Physical Exam Triage Vital Signs ED Triage Vitals  Encounter Vitals Group     BP 02/18/24 1109 135/79     Systolic BP Percentile --      Diastolic BP Percentile --      Pulse Rate 02/18/24 1109 61     Resp 02/18/24 1109 16     Temp 02/18/24 1109 98.3 F (36.8 C)     Temp Source 02/18/24 1109 Oral     SpO2 02/18/24 1109 98 %     Weight --      Height --      Head Circumference --  Peak Flow --      Pain Score 02/18/24 1106 7     Pain Loc --      Pain Education --      Exclude from Growth Chart --    No data found.  Updated Vital Signs BP 135/79 (BP Location: Left Arm)   Pulse 61   Temp 98.3 F (36.8 C) (Oral)   Resp 16   SpO2 98%   Visual Acuity Right Eye Distance:   Left Eye Distance:   Bilateral Distance:    Right Eye Near:   Left Eye Near:    Bilateral Near:     Physical Exam Vitals and nursing note reviewed.  Constitutional:      General: He is not in acute distress.    Appearance: Normal appearance. He is not ill-appearing.  HENT:     Head: Normocephalic and atraumatic.  Eyes:     Pupils: Pupils are equal, round, and reactive to light.  Cardiovascular:     Rate and Rhythm: Normal rate and regular rhythm.     Heart sounds: Normal heart sounds.  Pulmonary:     Effort: Pulmonary effort is normal.     Breath sounds: Normal breath sounds.  Musculoskeletal:        Arms:     Comments: There is no swelling, ecchymosis, deformity of the left ribs.  Tender palpation to the mid lateral ribs that extends slightly to the mid posterior ribs.  Equal chest expansion bilaterally.  No flail chest.  Skin:    General: Skin is warm and dry.  Neurological:     General: No focal deficit present.     Mental Status: He is alert and oriented to person, place, and time.  Psychiatric:        Mood and Affect: Mood normal.        Behavior: Behavior normal.      UC Treatments / Results  Labs (all labs ordered are listed, but only abnormal results are displayed) Labs Reviewed - No data to display  EKG   Radiology No results found.  Procedures Procedures (including critical care time)  Medications Ordered in UC Medications - No data to display  Initial Impression / Assessment and Plan / UC Course  I have reviewed the triage vital signs and the nursing notes.  Pertinent labs & imaging results that were available during my care of the patient were reviewed by me and considered in my medical decision making (see chart for details).     Reviewed exam and sx with patient, no red flags.  Wet read of x-ray without obvious rib fracture or pneumo.  Will contact for any positive results based on radiology overread once available.  Discussed rib contusion and supportive care with OTC analgesics, splinting, cool compresses.  Advised PCP follow-up 2 to 3 days for recheck.  Strict ER precautions reviewed and patient verbalized understanding. Final Clinical Impressions(s) / UC Diagnoses   Final diagnoses:  Rib pain on left side  Contusion of rib on left side, initial encounter     Discharge Instructions      You may take over the counter ibuprofen or tylenol as needed for rib pain. You may use cool compresses as needed. Splint the area when you need to, cough, sneeze.  Please follow-up with your PCP in 2 to 3 days for recheck.  Please go to the ER if you develop any  worsening symptoms.  This includes but is not limited to worsening pain, shortness  of breath, coughing up blood, or any new concerns that arise.  Hope you feel better soon!     ED Prescriptions   None    PDMP not reviewed this encounter.   Alleen Arbour, NP 02/18/24 1157

## 2024-02-18 NOTE — ED Triage Notes (Signed)
 Pt presents to UC for c/o left rib pain since yesterday. Pt states he was walking his dogs yesterday and slipped on wet rocks and fell on his left side. Pt reports pain with laughing, coughing, sneezing. Has not taken anything for pain relief.

## 2024-02-18 NOTE — Discharge Instructions (Addendum)
 You may take over the counter ibuprofen or tylenol as needed for rib pain. You may use cool compresses as needed. Splint the area when you need to, cough, sneeze.  Please follow-up with your PCP in 2 to 3 days for recheck.  Please go to the ER if you develop any worsening symptoms.  This includes but is not limited to worsening pain, shortness of breath, coughing up blood, or any new concerns that arise.  Hope you feel better soon!
